# Patient Record
Sex: Female | Born: 1965
Health system: Southern US, Community
[De-identification: ages and names within clinical notes are randomized; demographics above are authoritative.]

## PROBLEM LIST (undated history)

## (undated) DIAGNOSIS — I10 Essential (primary) hypertension: Secondary | ICD-10-CM

## (undated) DIAGNOSIS — G2581 Restless legs syndrome: Secondary | ICD-10-CM

## (undated) DIAGNOSIS — R000 Tachycardia, unspecified: Secondary | ICD-10-CM

## (undated) DIAGNOSIS — F419 Anxiety disorder, unspecified: Secondary | ICD-10-CM

## (undated) HISTORY — PX: TUBAL LIGATION: SHX77

## (undated) HISTORY — DX: Anxiety disorder, unspecified: F41.9

## (undated) HISTORY — PX: CHOLECYSTECTOMY: SHX55

## (undated) HISTORY — DX: Restless legs syndrome: G25.81

## (undated) HISTORY — PX: ENDOMETRIAL ABLATION: SHX621

## (undated) HISTORY — DX: Essential (primary) hypertension: I10

---

## 2000-04-30 ENCOUNTER — Emergency Department (HOSPITAL_COMMUNITY): Admission: EM | Admit: 2000-04-30 | Discharge: 2000-04-30 | Payer: Self-pay | Admitting: Emergency Medicine

## 2000-04-30 ENCOUNTER — Encounter: Payer: Self-pay | Admitting: Emergency Medicine

## 2001-06-15 ENCOUNTER — Encounter: Payer: Self-pay | Admitting: Internal Medicine

## 2001-06-15 ENCOUNTER — Ambulatory Visit (HOSPITAL_COMMUNITY): Admission: RE | Admit: 2001-06-15 | Discharge: 2001-06-15 | Payer: Self-pay | Admitting: Internal Medicine

## 2001-07-25 ENCOUNTER — Emergency Department (HOSPITAL_COMMUNITY): Admission: EM | Admit: 2001-07-25 | Discharge: 2001-07-25 | Payer: Self-pay | Admitting: Emergency Medicine

## 2001-07-25 ENCOUNTER — Encounter: Payer: Self-pay | Admitting: Emergency Medicine

## 2002-01-01 ENCOUNTER — Ambulatory Visit (HOSPITAL_COMMUNITY): Admission: RE | Admit: 2002-01-01 | Discharge: 2002-01-01 | Payer: Self-pay | Admitting: Family Medicine

## 2002-01-01 ENCOUNTER — Encounter: Payer: Self-pay | Admitting: Family Medicine

## 2002-01-01 ENCOUNTER — Emergency Department (HOSPITAL_COMMUNITY): Admission: EM | Admit: 2002-01-01 | Discharge: 2002-01-01 | Payer: Self-pay | Admitting: Emergency Medicine

## 2002-01-08 ENCOUNTER — Encounter: Payer: Self-pay | Admitting: Family Medicine

## 2002-01-08 ENCOUNTER — Ambulatory Visit (HOSPITAL_COMMUNITY): Admission: RE | Admit: 2002-01-08 | Discharge: 2002-01-08 | Payer: Self-pay | Admitting: Family Medicine

## 2002-01-09 ENCOUNTER — Ambulatory Visit (HOSPITAL_COMMUNITY): Admission: RE | Admit: 2002-01-09 | Discharge: 2002-01-09 | Payer: Self-pay | Admitting: General Surgery

## 2008-04-21 ENCOUNTER — Ambulatory Visit (HOSPITAL_COMMUNITY): Admission: RE | Admit: 2008-04-21 | Discharge: 2008-04-21 | Payer: Self-pay | Admitting: Internal Medicine

## 2008-09-12 ENCOUNTER — Ambulatory Visit (HOSPITAL_COMMUNITY): Admission: RE | Admit: 2008-09-12 | Discharge: 2008-09-12 | Payer: Self-pay | Admitting: Family Medicine

## 2009-02-08 ENCOUNTER — Emergency Department (HOSPITAL_COMMUNITY): Admission: EM | Admit: 2009-02-08 | Discharge: 2009-02-08 | Payer: Self-pay | Admitting: Emergency Medicine

## 2009-02-27 ENCOUNTER — Ambulatory Visit: Payer: Self-pay | Admitting: Cardiology

## 2009-03-06 ENCOUNTER — Ambulatory Visit: Payer: Self-pay | Admitting: Cardiology

## 2009-03-06 ENCOUNTER — Encounter: Payer: Self-pay | Admitting: Cardiology

## 2009-03-06 ENCOUNTER — Ambulatory Visit (HOSPITAL_COMMUNITY): Admission: RE | Admit: 2009-03-06 | Discharge: 2009-03-06 | Payer: Self-pay | Admitting: Cardiology

## 2009-03-17 ENCOUNTER — Ambulatory Visit: Payer: Self-pay | Admitting: Cardiology

## 2009-09-16 ENCOUNTER — Encounter: Payer: Self-pay | Admitting: Cardiology

## 2009-09-17 LAB — CONVERTED CEMR LAB
AST: 8 units/L (ref 0–37)
Albumin: 4.8 g/dL (ref 3.5–5.2)
Cholesterol: 154 mg/dL (ref 0–200)
Indirect Bilirubin: 0.3 mg/dL (ref 0.0–0.9)
LDL Cholesterol: 102 mg/dL — ABNORMAL HIGH (ref 0–99)
Total CHOL/HDL Ratio: 4.3
Triglycerides: 79 mg/dL (ref <150)
VLDL: 16 mg/dL (ref 0–40)

## 2009-10-01 ENCOUNTER — Other Ambulatory Visit: Admission: RE | Admit: 2009-10-01 | Discharge: 2009-10-01 | Payer: Self-pay | Admitting: Obstetrics and Gynecology

## 2009-10-14 ENCOUNTER — Ambulatory Visit (HOSPITAL_COMMUNITY): Admission: RE | Admit: 2009-10-14 | Discharge: 2009-10-14 | Payer: Self-pay | Admitting: Obstetrics and Gynecology

## 2009-10-21 ENCOUNTER — Ambulatory Visit (HOSPITAL_COMMUNITY): Admission: RE | Admit: 2009-10-21 | Discharge: 2009-10-21 | Payer: Self-pay | Admitting: Obstetrics and Gynecology

## 2010-07-23 ENCOUNTER — Emergency Department (HOSPITAL_COMMUNITY): Admission: EM | Admit: 2010-07-23 | Discharge: 2010-07-23 | Payer: Self-pay | Admitting: Emergency Medicine

## 2010-10-05 ENCOUNTER — Ambulatory Visit (HOSPITAL_COMMUNITY): Admission: RE | Admit: 2010-10-05 | Discharge: 2010-10-05 | Payer: Self-pay | Admitting: Obstetrics and Gynecology

## 2010-10-06 ENCOUNTER — Emergency Department (HOSPITAL_COMMUNITY): Admission: EM | Admit: 2010-10-06 | Discharge: 2010-10-06 | Payer: Self-pay | Admitting: Emergency Medicine

## 2011-03-09 LAB — BASIC METABOLIC PANEL
CO2: 28 mEq/L (ref 19–32)
GFR calc Af Amer: >60 mL/min (ref >60)
Glucose, Bld: 95 mg/dL (ref 70–99)
Potassium: 4.5 mEq/L (ref 3.5–5.1)
Sodium: 139 mEq/L (ref 135–145)

## 2011-03-09 LAB — URINALYSIS, ROUTINE W REFLEX MICROSCOPIC
Glucose, UA: 100 mg/dL — AB
Hgb urine dipstick: NEGATIVE
Ketones, ur: NEGATIVE mg/dL
Protein, ur: NEGATIVE mg/dL
Urobilinogen, UA: 0.2 mg/dL (ref 0.0–1.0)
pH: 6 (ref 5.0–8.0)

## 2011-03-09 LAB — CBC
HCT: 40.5 % (ref 36.0–46.0)
MCH: 30.9 pg (ref 26.0–34.0)
MCHC: 34.1 g/dL (ref 30.0–36.0)

## 2011-03-09 LAB — URINE CULTURE
Colony Count: NO GROWTH
Culture  Setup Time: 201110122141
Culture: NO GROWTH

## 2011-03-09 LAB — GLUCOSE, CAPILLARY: Glucose-Capillary: 130 mg/dL — ABNORMAL HIGH (ref 70–99)

## 2011-03-12 LAB — COMPREHENSIVE METABOLIC PANEL
ALT: 14 U/L (ref 0–35)
Albumin: 4.1 g/dL (ref 3.5–5.2)
Chloride: 102 mEq/L (ref 96–112)
Creatinine, Ser: 0.82 mg/dL (ref 0.4–1.2)
GFR calc non Af Amer: >60 mL/min (ref >60)
Glucose, Bld: 89 mg/dL (ref 70–99)
Potassium: 3.5 mEq/L (ref 3.5–5.1)
Total Protein: 7.2 g/dL (ref 6.0–8.3)

## 2011-03-12 LAB — CBC
HCT: 39.1 % (ref 36.0–46.0)
Hemoglobin: 13.4 g/dL (ref 12.0–15.0)
MCH: 31.2 pg (ref 26.0–34.0)
MCHC: 34.3 g/dL (ref 30.0–36.0)
MCV: 91 fL (ref 78.0–100.0)
Platelets: 205 10*3/uL (ref 150–400)
RBC: 4.29 MIL/uL (ref 3.87–5.11)

## 2011-03-12 LAB — DIFFERENTIAL: Monocytes Relative: 5 % (ref 3–12)

## 2011-04-12 LAB — CBC
HCT: 42.5 % (ref 36.0–46.0)
Hemoglobin: 14.8 g/dL (ref 12.0–15.0)
MCV: 90.9 fL (ref 78.0–100.0)
RBC: 4.67 MIL/uL (ref 3.87–5.11)
RDW: 13.2 % (ref 11.5–15.5)

## 2011-04-12 LAB — BASIC METABOLIC PANEL
Calcium: 9.5 mg/dL (ref 8.4–10.5)
GFR calc Af Amer: >60 mL/min (ref >60)
Glucose, Bld: 113 mg/dL — ABNORMAL HIGH (ref 70–99)
Potassium: 3.3 mEq/L — ABNORMAL LOW (ref 3.5–5.1)
Sodium: 139 mEq/L (ref 135–145)

## 2011-04-12 LAB — URINALYSIS, ROUTINE W REFLEX MICROSCOPIC
Glucose, UA: NEGATIVE mg/dL
Ketones, ur: NEGATIVE mg/dL
Nitrite: NEGATIVE
Protein, ur: NEGATIVE mg/dL
pH: 5.5 (ref 5.0–8.0)

## 2011-04-12 LAB — POCT CARDIAC MARKERS
CKMB, poc: <1 ng/mL — ABNORMAL LOW (ref 1.0–8.0)
Troponin i, poc: <0.05 ng/mL (ref 0.00–0.09)
Troponin i, poc: <0.05 ng/mL (ref 0.00–0.09)

## 2011-04-12 LAB — DIFFERENTIAL
Basophils Relative: 0 % (ref 0–1)
Eosinophils Absolute: 0.2 10*3/uL (ref 0.0–0.7)
Monocytes Absolute: 0.8 10*3/uL (ref 0.1–1.0)
Monocytes Relative: 5 % (ref 3–12)
Neutrophils Relative %: 71 % (ref 43–77)

## 2011-04-12 LAB — D-DIMER, QUANTITATIVE: D-Dimer, Quant: 0.26 ug/mL-FEU (ref 0.00–0.48)

## 2011-05-10 NOTE — Assessment & Plan Note (Signed)
[facility]                       [facility] CARDIOLOG[facility] OFFICE NOTE   SHAZIA, MITCHENER                   MRN:          [id number]  DATE:02/27/2009                            DOB:          1966-04-21    REFERRING PHYSICIAN:  Catalina Pizza, M.D.   REASON FOR VISIT:  Chest pain.   HISTORY OF PRESENT ILLNESS:  Joanna Simpson is a 45 year old female  patient who recently developed chest pain.  She has had borderline high  blood pressures in the past and her pressures started to increase  recently.  She developed chest discomfort about 3 weeks ago while at  rest.  She described this as a dull sensation in her substernal chest.  She did have radiation up to her right jaw as well as associated  shortness of breath and diaphoresis.  She had no nausea.  She had no  syncope or near syncope.  She was evaluated at the [facility]  emergency room.  She had point of care markers negative x1.  Her other  lab work was negative.  Notably, her D-dimer was negative at 0.26.  Her  only abnormality was a low potassium at 3.3 and an elevated white count  at 15,400.  Chest x-ray was normal.  She was noted to have a blood  pressure of 170/110 and was referred back to her primary care physician  for further evaluation.  Dr. Margo Aye placed her on Coreg CR for her blood  pressure and asked her to see [place] today for further evaluation of her  chest pain.  Since she went to the emergency room, she has had several  episodes of chest discomfort that occurred at rest.  They are brief in  nature and only lasted a couple of minutes.  She has associated  shortness of breath.  She has no other associated symptoms.  She denies  any exertional chest pain or shortness of breath.   PAST MEDICAL HISTORY:  As noted above.  In addition, she has:  1. Congenital solitary right kidney.  2. Status post cholecystectomy.   MEDICATIONS:  1. Coreg CR 20 mg daily.  2. Lorazepam p.r.n.  3. BC  Powder p.r.n.   ALLERGIES:  No known drug allergies.   SOCIAL HISTORY:  The patient smokes half pack cigarettes per day for the  last 20 years.  The patient lives in [place].  She is married, has 3  children.  She is a Futures trader.  She is also going to school.   FAMILY HISTORY:  Insignificant for premature CAD.   REVIEW OF SYSTEMS:  Please see HPI.  She has had significant bouts of  right leg pain.  Over the last several months, she has had several  studies which included an EMG that was done recently.  She says that  this was negative.  She had venous Dopplers also performed in April 2009  that showed no evidence of DVT.  She notes pain at rest as well as with  exertion.  Mainly in her lateral calf and sometimes up into her lateral  thigh.  She denies fevers, chills, cough, melena, hematochezia,  hematuria, dysuria.  Rest of the review of systems are negative.   PHYSICAL EXAMINATION:  GENERAL:  She is a well-nourished, well-developed  female in no acute distress.  VITAL SIGNS:  Blood pressure is 130/90, pulse 82, weight 169 pounds.  HEENT:  Normal.  NECK:  Without JVD.  LYMPHATICS:  Without lymphadenopathy.  ENDOCRINE:  Without  thyromegaly.  CARDIAC:  S1 and S2. Regular rate and rhythm.  No murmur.  LUNGS:  Clear to auscultation bilaterally.  No wheezing, no rales.  ABDOMEN:  Soft, nontender with normoactive bowel sounds.  No  organomegaly.  EXTREMITIES:  Without edema.  NEUROLOGIC:  She is alert and oriented x3.  Cranial nerves II through  XII grossly intact.  SKIN:  Warm and dry.  VASCULAR:  I could not appreciate carotid bruits bilaterally.  Femoral  artery pulses are difficult to palpate through her clothing.  No bruits  are auscultated.  Dorsalis pedis and posterior tibial pulses are 2+  bilaterally.   Electrocardiogram reveals sinus rhythm with a heart rate of 82, normal  axis, nonspecific ST-T wave changes, poor R-wave progression.   ASSESSMENT AND PLAN:  1. Chest  pain.  This patient has atypical chest symptoms for ischemia.      She does have some risk factors for coronary artery disease      including smoking history and hypertension.  I have discussed the      case today with Dr. Dietrich Pates who has also seen the patient.  After      further review with Dr. Dietrich Pates, we have decided to proceed with      baseline echocardiogram as well as a stress echocardiogram to      assess for ischemia.  She is already on aspirin.  We will also give      her nitroglycerin to take p.r.n.  We will see her back after her      stress test.  We will contact Dr. Margo Aye to see if she has had a      recent lipid panel.  If not, we will go ahead and draw lipids.  2. Right leg pain.  She has some symptoms that are somewhat concerning      for peripheral arterial disease.  She has had a negative workup      thus far.  On physical exam, she has good pulses, but we will go      ahead and set her for ABIs to rule out the possibility of      peripheral arterial disease, especially in the setting of her      ongoing tobacco abuse.  3. Hypertension.  This is better controlled on Coreg.  After further      review with Dr. Dietrich Pates, we have decided to place her on HCTZ 25      mg half a tablet daily.  She will have a BMET drawn in 1 week's      time to reassess her renal function and potassium.  She has been      asked to increase her dietary potassium for now.   DISPOSITION:  The patient will be brought back in followup after a  stress test with Dr. Dietrich Pates in 2 weeks.      Tereso Newcomer, PA-C  Electronically Signed      Gerrit Friends. Dietrich Pates, MD, [facility]  Electronically Signed   SW/MedQ  DD: 02/27/2009  DT: 02/28/2009  Job #: [id number]

## 2011-05-10 NOTE — Letter (Signed)
March 17, 2009    Catalina Pizza, MD  [address] [place],  [place] [address]   RE:  Joanna Simpson, Joanna Simpson  MRN:  [id number]  /  DOB:  05-20-1966   Dear Ian Malkin,   [name] returns to the office for continued assessment and  treatment of hypertension, dyslipidemia, tobacco use, and chest  discomfort.  Since her last visit, she has become motivated to  substantially improve her health profile.  She is dieting and has lost 5  pounds.  She is exercising regularly.  She is taking her medication as  recommended except that she misunderstood the directions regarding  hydrochlorothiazide and carvedilol, and has stopped her carvedilol.  Her  only other medication is famotidine 20 mg daily.   She has not had chest discomfort.  There has been no lightheadedness and  certainly no loss of consciousness.  She has had no dyspnea.   Testing has generally been good.  Her chemistry profile is normal.  Lipids are good in terms of total cholesterol of 137 and LDL of 86.  Triglycerides are normal; however, HDL is low at 25.  She is  premenopausal and has had normal thyroid function in the past.  She does  not drink alcohol nor does she intend to start.   ABIs were normal.  She continues to have intermittent discomfort,  primarily in her right leg.  The other day, she attempted to arise from  a chair, experienced numbness in both lower extremities and ultimately  had to sit back down.  She has had no bowel or bladder symptoms.  She  has no unsteadiness of gait.  She has no muscle weakness.   Her stress echocardiogram was negative for ischemia.  The heart was  structurally normal with normal left ventricular systolic function.  She  did have mild LVH.  She achieved a good work load, and a good peak heart  rate.  She was reported to have a hypertensive response to exercise;  however, the highest blood pressure reported was 162/82.   PHYSICAL EXAMINATION:  GENERAL:  Pleasant, somewhat overweight woman in  no acute distress.  VITAL SIGNS:  The weight is 165, blood pressure 125/85; initially  120/95, heart rate 75 and regular.  NECK:  No jugular venous distention; no carotid bruits.  LUNGS:  Clear.  CARDIAC:  Normal first and second heart sounds.  EXTREMITIES:  No edema.   IMPRESSION:  [name] is doing well in general.  She is at very low  risk for vascular disease.  Blood pressure control is marginal.  We will  resume carvedilol at a dose of 12.5 mg b.i.d.  She will continue  hydrochlorothiazide 12.5 mg daily.  She does not require pharmacologic  therapy for a low HDL value.  Her new found interest in exercise may  result in some improvement.  There is no etiology identified as yet for  her lower extremity symptoms.  As long as her neurologic exam remains  normal, she may need to tolerate the minor problems that she is  experiencing.   Thanks so much for sending this nice woman to see me.  Please let me  know at any time that I can assist in her care.  We discussed cessation  of tobacco.  She is eager to do this, but I suggest that she defer it  until she reaches her ideal weight or until she reaches a stable weight.    Sincerely,  Gerrit Friends. Dietrich Pates, MD, [facility]  Electronically Signed    RMR/MedQ  DD: 03/17/2009  DT: 03/18/2009  Job #: [id number]

## 2011-05-13 NOTE — Op Note (Signed)
[facility]  Patient:    Joanna Simpson, MCGILLIS Visit Number: [id number] MRN: [id number]          Service Type: OUT Location: RAD Attending Physician:  Darlin Priestly Dictated by:   Franky Macho, M.D. Proc. Date: 01/09/02 Admit Date:  01/08/2002   CC:         Dorthey Sawyer, M.D.   Operative Report  PREOPERATIVE DIAGNOSIS:  Chronic cholecystitis.  POSTOPERATIVE DIAGNOSIS:  Chronic cholecystitis.  PROCEDURE:  Laparoscopic cholecystectomy.  SURGEON:  Franky Macho, M.D.  ASSISTANT:  Arna Snipe, M.D.  ANESTHESIA:  General endotracheal.  INDICATIONS:  The patient is a 45 year old white female who presents with biliary colic and a gallbladder polyps.  The risks and the benefits of the procedure including bleeding, infection, hepatobiliary injury, and the possibility of an open procedure were fully explained to the patient, giving informed consent.  PROCEDURE NOTE:  The patient was placed in the supine position.  After induction of general endotracheal anesthesia, the abdomen was prepped and draped using the usual sterile technique with Betadine.  A supraumbilical incision was made down to the fascia.  The Veress needle was introduced into the uterine cavity, and confirmation of placement was done using the saline drop test.  The abdomen was then insufflated to 16 mmHg. An 11 mm trocar was introduced into the abdominal cavity under direct visualization without difficulty.  The patient was placed in a reverse Trendelenburg position, and an additional 11 mm trocar and 5 mm trocars were placed in the upper abdomen under direct visualization. The liver was inspected and noted to be within normal limits.  The gallbladder was retracted superiorly and laterally.  The dissection was begun around the infundibulum of the gallbladder.  The cystic duct was first identified.  Its juncture to the infundibulum was identified, and the clips were placed  proximally and distally on the cystic duct, and the cystic duct was divided.  This was likewise done to the cystic artery.  The gallbladder was then freed away from the gallbladder fossa using Bovie electrocautery.  The gallbladder was delivered through the epigastric trocar site without difficulty.  The gallbladder fossa was inspected, and no abnormal bleeding or bile leakage was noted.  Surgicel was placed in the gallbladder fossa.  The subhepatic space as well as right hepatic gutter were irrigated with normal saline.  All fluid and air were then evacuated from the abdominal cavity prior to removal of the trocars.  The supraumbilical fascia was reapproximated using a 0 Vicryl interrupted suture.  0.5% Marcaine was instilled in the wounds, and the wounds were closed with staples.  All needle counts were correct at the end of the procedure.  The patient was extubated in the operating room and went back to the recovery room, awake and in stable condition.  COMPLICATIONS:  None.  SPECIMENS:  Gallbladder.  BLOOD LOSS:  None. Dictated by:   Franky Macho, M.D. Attending Physician:  Darlin Priestly DD:  01/09/02 TD:  01/09/02 Job: [id number]

## 2011-06-17 ENCOUNTER — Emergency Department (HOSPITAL_COMMUNITY)
Admission: EM | Admit: 2011-06-17 | Discharge: 2011-06-17 | Disposition: A | Discharge status: Home / Self Care | Payer: PRIVATE HEALTH INSURANCE | Attending: Emergency Medicine | Admitting: Emergency Medicine

## 2011-06-17 ENCOUNTER — Emergency Department (HOSPITAL_COMMUNITY): Payer: PRIVATE HEALTH INSURANCE

## 2011-06-17 DIAGNOSIS — F17200 Nicotine dependence, unspecified, uncomplicated: Secondary | ICD-10-CM | POA: Insufficient documentation

## 2011-06-17 DIAGNOSIS — Z8249 Family history of ischemic heart disease and other diseases of the circulatory system: Secondary | ICD-10-CM | POA: Insufficient documentation

## 2011-06-17 DIAGNOSIS — I10 Essential (primary) hypertension: Secondary | ICD-10-CM | POA: Insufficient documentation

## 2011-06-17 DIAGNOSIS — R61 Generalized hyperhidrosis: Secondary | ICD-10-CM | POA: Insufficient documentation

## 2011-06-17 DIAGNOSIS — R079 Chest pain, unspecified: Secondary | ICD-10-CM | POA: Insufficient documentation

## 2011-06-17 LAB — BASIC METABOLIC PANEL
BUN: 24 mg/dL — ABNORMAL HIGH (ref 6–23)
Chloride: 105 mEq/L (ref 96–112)
GFR calc Af Amer: >60 mL/min (ref >60)
GFR calc non Af Amer: >60 mL/min (ref >60)
Potassium: 3.4 mEq/L — ABNORMAL LOW (ref 3.5–5.1)
Sodium: 139 mEq/L (ref 135–145)

## 2011-06-17 LAB — CK TOTAL AND CKMB (NOT AT ARMC)
CK, MB: 1.5 ng/mL (ref 0.3–4.0)
CK, MB: 1.5 ng/mL (ref 0.3–4.0)
Relative Index: INVALID (ref 0.0–2.5)
Relative Index: INVALID (ref 0.0–2.5)

## 2011-06-17 LAB — DIFFERENTIAL
Basophils Absolute: 0.1 10*3/uL (ref 0.0–0.1)
Basophils Relative: 1 % (ref 0–1)
Eosinophils Absolute: 0.2 10*3/uL (ref 0.0–0.7)
Eosinophils Relative: 1 % (ref 0–5)
Lymphocytes Relative: 21 % (ref 12–46)
Monocytes Absolute: 0.9 10*3/uL (ref 0.1–1.0)

## 2011-06-17 LAB — CBC
HCT: 40.9 % (ref 36.0–46.0)
MCHC: 34.7 g/dL (ref 30.0–36.0)
Platelets: 222 10*3/uL (ref 150–400)
RDW: 12.8 % (ref 11.5–15.5)
WBC: 13.3 10*3/uL — ABNORMAL HIGH (ref 4.0–10.5)

## 2011-06-17 LAB — D-DIMER, QUANTITATIVE: D-Dimer, Quant: 0.22 ug/mL-FEU (ref 0.00–0.48)

## 2011-06-17 LAB — TROPONIN I: Troponin I: <0.3 ng/mL (ref <0.30)

## 2012-07-16 ENCOUNTER — Other Ambulatory Visit (HOSPITAL_COMMUNITY): Payer: Self-pay | Admitting: Interventional Radiology

## 2012-07-16 DIAGNOSIS — M545 Low back pain, unspecified: Secondary | ICD-10-CM

## 2012-07-17 ENCOUNTER — Other Ambulatory Visit (HOSPITAL_COMMUNITY): Payer: Self-pay | Admitting: Rheumatology

## 2012-07-17 DIAGNOSIS — M545 Low back pain, unspecified: Secondary | ICD-10-CM

## 2012-07-19 ENCOUNTER — Ambulatory Visit (HOSPITAL_COMMUNITY)
Admission: RE | Admit: 2012-07-19 | Discharge: 2012-07-19 | Disposition: A | Discharge status: Home / Self Care | Payer: PRIVATE HEALTH INSURANCE | Source: Ambulatory Visit | Attending: Interventional Radiology | Admitting: Interventional Radiology

## 2012-07-19 DIAGNOSIS — M79609 Pain in unspecified limb: Secondary | ICD-10-CM | POA: Insufficient documentation

## 2012-07-19 DIAGNOSIS — M545 Low back pain, unspecified: Secondary | ICD-10-CM

## 2012-07-19 DIAGNOSIS — M25559 Pain in unspecified hip: Secondary | ICD-10-CM | POA: Insufficient documentation

## 2012-07-19 DIAGNOSIS — M5126 Other intervertebral disc displacement, lumbar region: Secondary | ICD-10-CM | POA: Insufficient documentation

## 2012-10-24 ENCOUNTER — Other Ambulatory Visit: Payer: Self-pay | Admitting: Obstetrics and Gynecology

## 2012-10-24 DIAGNOSIS — Z139 Encounter for screening, unspecified: Secondary | ICD-10-CM

## 2012-11-02 ENCOUNTER — Inpatient Hospital Stay (HOSPITAL_COMMUNITY): Admission: RE | Admit: 2012-11-02 | Payer: PRIVATE HEALTH INSURANCE | Source: Ambulatory Visit

## 2013-09-30 ENCOUNTER — Ambulatory Visit (HOSPITAL_COMMUNITY)
Admission: RE | Admit: 2013-09-30 | Discharge: 2013-09-30 | Disposition: A | Discharge status: Home / Self Care | Payer: 59 | Source: Ambulatory Visit | Attending: Obstetrics and Gynecology | Admitting: Obstetrics and Gynecology

## 2013-09-30 DIAGNOSIS — Z1231 Encounter for screening mammogram for malignant neoplasm of breast: Secondary | ICD-10-CM | POA: Insufficient documentation

## 2013-09-30 DIAGNOSIS — Z139 Encounter for screening, unspecified: Secondary | ICD-10-CM

## 2013-10-21 ENCOUNTER — Other Ambulatory Visit (HOSPITAL_COMMUNITY)
Admission: RE | Admit: 2013-10-21 | Discharge: 2013-10-21 | Disposition: A | Discharge status: Home / Self Care | Payer: 59 | Source: Ambulatory Visit | Attending: Obstetrics and Gynecology | Admitting: Obstetrics and Gynecology

## 2013-10-21 ENCOUNTER — Ambulatory Visit (INDEPENDENT_AMBULATORY_CARE_PROVIDER_SITE_OTHER): Payer: 59 | Admitting: Obstetrics and Gynecology

## 2013-10-21 ENCOUNTER — Encounter (INDEPENDENT_AMBULATORY_CARE_PROVIDER_SITE_OTHER): Payer: Self-pay

## 2013-10-21 ENCOUNTER — Encounter: Payer: Self-pay | Admitting: Obstetrics and Gynecology

## 2013-10-21 VITALS — BP 144/88 | Ht 65.5 in | Wt 161.6 lb

## 2013-10-21 DIAGNOSIS — N393 Stress incontinence (female) (male): Secondary | ICD-10-CM | POA: Insufficient documentation

## 2013-10-21 DIAGNOSIS — Z1151 Encounter for screening for human papillomavirus (HPV): Secondary | ICD-10-CM | POA: Insufficient documentation

## 2013-10-21 DIAGNOSIS — Z1212 Encounter for screening for malignant neoplasm of rectum: Secondary | ICD-10-CM

## 2013-10-21 DIAGNOSIS — Z01419 Encounter for gynecological examination (general) (routine) without abnormal findings: Secondary | ICD-10-CM | POA: Insufficient documentation

## 2013-10-21 MED ORDER — TOLTERODINE TARTRATE ER 2 MG PO CP24: 2.0000 mg | ORAL_CAPSULE | Freq: Every day | ORAL | Status: DC

## 2013-10-21 NOTE — Patient Instructions (Addendum)
Please begin taking the bladder medication daily if you have further difficulties emptying her bladder please discontinued this medicine Urodynamic Study A urodynamic study is a set of tests and X-rays. These tests help to find out why you are having problems holding on to your pee (urine) or with peeing (urinating). It helps the doctor see your:  Bladder.  Urethra.  Valves in your body that control your pee (sphincters). TEST  Two thin tubes (catheters) are used. One is put in your bladder and the other is put into where your poop comes out (rectum).  The tube that is put into your bladder will be filled with a cup of germ free (sterile) water. The tubes help check how your bladder is doing as it is being filled up.  Once your bladder is filled, X-rays are taken while you cough or push down as if you were trying to poop (have a bowel movement).  This test will show why you are having a problem with leaking pee.  In the last part of the test, you will need to pee while the tube is still in your bladder.  The whole test will take about 30 to 45 minutes. When it is done, your doctor will talk to you about what kinds of treatments may work best for you. Document Released: 11/24/2008 Document Revised: 03/05/2012 Document Reviewed: 11/24/2008 [facility] Patient Information 2014 [place], [place].

## 2013-10-21 NOTE — Progress Notes (Signed)
Patient ID: Joanna Simpson, female   DOB: Aug 03, 1966, 47 y.o.   MRN: [id number] Pt here for annual exam today and c/o urinary incontinence.  Assessment:  Annual Gyn Exam Stress urinary incontinence, with need for urodynamics referral History of congenital single kidney on the right  mild urge incontinence Plan:  1. pap smear done, next pap due 87yr 2. return annually or prn 3    Annual mammogram advised Trial of Vesicare 5 mg daily.patient aware to pay attention be sure that no urinary retention symptoms are increased Subjective:  Joanna Simpson is a 47 y.o. female No obstetric history on file. who presents for annual exam. No LMP recorded. Patient has had an ablation. The patient has complaints today of Joanna Simpson mentions that in addition to loss of control of her urine she sometimes has to double void in order to get her bladder emptying she has pain above the symphysis pubis when she needs to void  The following portions of the patient's history were reviewed and updated as appropriate: allergies, current medications, past family history, past medical history, past social history, past surgical history and problem list.  Review of Systems Constitutional: negative, occasional constipation requiring Dulcolax tablets Gastrointestinal: negative except for constipation Genitourinary: sui. Nocturia x 1-2, has sense of difficulty emptying, sometimes has urinary loss simply with walking also positive for urge symptoms and voiding onset prior to reaching the bathroom  Objective:  BP 144/88  Ht 5' 5.5" (1.664 m)  Wt 161 lb 9.6 oz (73.301 kg)  BMI 26.47 kg/m2   BMI: Body mass index is 26.47 kg/(m^2).  General Appearance: Alert, appropriate appearance for age. No acute distress HEENT: Grossly normal Neck / Thyroid:  Cardiovascular: RRR; normal S1, S2, no murmur Lungs: CTA bilaterally Back: No CVAT Breast Exam: No masses or nodes.No dimpling, nipple retraction or  discharge. Gastrointestinal: Soft, non-tender, no masses or organomegaly Pelvic Exam: Vulva and vagina appear normal. Bimanual exam reveals normal uterus and adnexa. Rectovaginal: normal rectal, no masses and guaiac negative stool obtained Lymphatic Exam: Non-palpable nodes in neck, clavicular, axillary, or inguinal regions Skin: no rash or abnormalities Neurologic: Normal gait and speech, no tremor  Psychiatric: Alert and oriented, appropriate affect.  Urinalysis:Not done  Joanna Simpson. MD Pgr [phone] 9:53 AM

## 2013-10-23 ENCOUNTER — Emergency Department (HOSPITAL_COMMUNITY)
Admission: EM | Admit: 2013-10-23 | Discharge: 2013-10-23 | Disposition: A | Discharge status: Home / Self Care | Payer: 59 | Attending: Emergency Medicine | Admitting: Emergency Medicine

## 2013-10-23 ENCOUNTER — Encounter (HOSPITAL_COMMUNITY): Payer: Self-pay | Admitting: Emergency Medicine

## 2013-10-23 ENCOUNTER — Emergency Department (HOSPITAL_COMMUNITY): Payer: 59

## 2013-10-23 DIAGNOSIS — R209 Unspecified disturbances of skin sensation: Secondary | ICD-10-CM | POA: Insufficient documentation

## 2013-10-23 DIAGNOSIS — J069 Acute upper respiratory infection, unspecified: Secondary | ICD-10-CM | POA: Insufficient documentation

## 2013-10-23 DIAGNOSIS — R0602 Shortness of breath: Secondary | ICD-10-CM | POA: Insufficient documentation

## 2013-10-23 DIAGNOSIS — Z79899 Other long term (current) drug therapy: Secondary | ICD-10-CM | POA: Insufficient documentation

## 2013-10-23 DIAGNOSIS — R51 Headache: Secondary | ICD-10-CM | POA: Insufficient documentation

## 2013-10-23 DIAGNOSIS — G2581 Restless legs syndrome: Secondary | ICD-10-CM | POA: Insufficient documentation

## 2013-10-23 DIAGNOSIS — F411 Generalized anxiety disorder: Secondary | ICD-10-CM | POA: Insufficient documentation

## 2013-10-23 DIAGNOSIS — F17200 Nicotine dependence, unspecified, uncomplicated: Secondary | ICD-10-CM | POA: Insufficient documentation

## 2013-10-23 DIAGNOSIS — R519 Headache, unspecified: Secondary | ICD-10-CM

## 2013-10-23 DIAGNOSIS — R079 Chest pain, unspecified: Secondary | ICD-10-CM | POA: Insufficient documentation

## 2013-10-23 DIAGNOSIS — I10 Essential (primary) hypertension: Secondary | ICD-10-CM | POA: Insufficient documentation

## 2013-10-23 DIAGNOSIS — R202 Paresthesia of skin: Secondary | ICD-10-CM

## 2013-10-23 HISTORY — DX: Tachycardia, unspecified: R00.0

## 2013-10-23 LAB — BASIC METABOLIC PANEL
BUN: 15 mg/dL (ref 6–23)
CO2: 29 mEq/L (ref 19–32)
Chloride: 96 mEq/L (ref 96–112)
Creatinine, Ser: 0.8 mg/dL (ref 0.50–1.10)
Glucose, Bld: 99 mg/dL (ref 70–99)

## 2013-10-23 LAB — CBC WITH DIFFERENTIAL/PLATELET
HCT: 43.7 % (ref 36.0–46.0)
Hemoglobin: 15.3 g/dL — ABNORMAL HIGH (ref 12.0–15.0)
Lymphocytes Relative: 24 % (ref 12–46)
Lymphs Abs: 3.5 10*3/uL (ref 0.7–4.0)
MCHC: 35 g/dL (ref 30.0–36.0)
Monocytes Absolute: 0.7 10*3/uL (ref 0.1–1.0)
Monocytes Relative: 5 % (ref 3–12)
Neutro Abs: 10.1 10*3/uL — ABNORMAL HIGH (ref 1.7–7.7)
Platelets: 231 10*3/uL (ref 150–400)

## 2013-10-23 MED ORDER — PROCHLORPERAZINE EDISYLATE 5 MG/ML IJ SOLN
10.0000 mg | Freq: Once | INTRAMUSCULAR | Status: AC
  Administered 2013-10-23: 10 mg via INTRAVENOUS
  Filled 2013-10-23: qty 2

## 2013-10-23 MED ORDER — SODIUM CHLORIDE 0.9 % IV BOLUS (SEPSIS)
500.0000 mL | Freq: Once | INTRAVENOUS | Status: AC
  Administered 2013-10-23: 17:00:00 via INTRAVENOUS

## 2013-10-23 MED ORDER — KETOROLAC TROMETHAMINE 30 MG/ML IJ SOLN
30.0000 mg | Freq: Once | INTRAMUSCULAR | Status: AC
  Administered 2013-10-23: 30 mg via INTRAVENOUS
  Filled 2013-10-23: qty 1

## 2013-10-23 NOTE — ED Notes (Addendum)
Pt c/o  chest pain and tachycardia since yesterday.  Reports symptoms got better then started back during the night.   Reports chest pain is gone at this time and HR slower but,  c/o arms feeling numb and heavy x 3 hours.  Also c/o headache.

## 2013-10-23 NOTE — ED Provider Notes (Signed)
CSN: [id number]     Arrival date & time 10/23/13  1556 History  This chart was scribed for [employer]. Rubin Payor, MD by Ardelia Mems, ED Scribe. This patient was seen in room APA18/APA18 and the patient's care was started at 4:41 PM.   Chief Complaint  Patient presents with  . arms numb     The history is provided by the patient. No language interpreter was used.    HPI Comments: Joanna Simpson is a 47 y.o. female with a history of anxiety, HTN and tachycardia who presents to the Emergency Department complaining of persistent bilateral arm numbness onset yesterday. She also reports paresthesias in the fingers of her bilateral hands. She reports associated episodes of intermittent, non-radiating center chest pain onset yesterday which she states she is not having currently. She states that there are no modifying factors for her chest pain. She reports associated SOB during her episodes of chest pain. She also states that she has had a persistent, non-productive cough for the past month onset after burning some trash, and she states that this cough has worsened over the past 5 days. She states that she has no history of pulmonary disease. She also reports a persistent, severe headache over the past 5 days. She states that she has a history of tachycardia which she has had to receive adenosine shots for in the past. She states that she has no history of thyroid disease. She denies fever or any other symptoms. She states that she does not use cocaine, caffeine, weight loss drugs or any other stimulants. She is a current every day smoker of 5 packs/day. She states that she is not driving today. She states that she has no medication allergies.  PCP- Dr. Assunta Found   Past Medical History  Diagnosis Date  . Hypertension   . Anxiety   . Restless leg syndrome   . Tachycardia    Past Surgical History  Procedure Laterality Date  . Tubal ligation    . Cholecystectomy    . Endometrial ablation      Family History  Problem Relation Age of Onset  . Diabetes Mother   . Hypertension Mother   . Cancer Mother     pancreatic  . Diabetes Father   . Hypertension Father   . Cancer Father     colon  . Diabetes Sister    History  Substance Use Topics  . Smoking status: Current Every Day Smoker -- 5.00 packs/day    Types: Cigarettes  . Smokeless tobacco: Never Used  . Alcohol Use: No   OB History   Grav Para Term Preterm Abortions TAB SAB Ect Mult Living                 Review of Systems  Constitutional: Negative for fever.  Respiratory: Positive for shortness of breath.   Cardiovascular: Positive for chest pain.  Neurological: Positive for numbness (bilateral arms.) and headaches.       Paresthesias to fingers of bilateral hands.  All other systems reviewed and are negative.   Allergies  Review of patient's allergies indicates no known allergies.  Home Medications   Current Outpatient Rx  Name  Route  Sig  Dispense  Refill  . ALPRAZolam (XANAX) 1 MG tablet   Oral   Take 1 mg by mouth as needed.          Marland Kitchen rOPINIRole (REQUIP) 2 MG tablet   Oral   Take 2 mg by mouth at bedtime.          Marland Kitchen  tolterodine (DETROL LA) 2 MG 24 hr capsule   Oral   Take 1 capsule (2 mg total) by mouth daily.   30 capsule   3    Triage Vitals: BP 134/79  Pulse 97  Temp(Src) 98.1 F (36.7 C) (Oral)  Resp 18  Ht 5\' 5"  (1.651 m)  Wt 155 lb (70.308 kg)  BMI 25.79 kg/m2  SpO2 100%  Physical Exam  Nursing note and vitals reviewed. Constitutional: She is oriented to person, place, and time. She appears well-developed and well-nourished. No distress.  HENT:  Head: Normocephalic and atraumatic.  Eyes: EOM are normal.  Neck: Neck supple. No tracheal deviation present.  Cardiovascular: Normal rate, regular rhythm and normal heart sounds.   Pulmonary/Chest: Effort normal. No respiratory distress.  Mildly harsh breath sounds.  Abdominal: Soft. There is no tenderness.   Musculoskeletal: Normal range of motion.  Neurological: She is alert and oriented to person, place, and time. No cranial nerve deficit. She exhibits normal muscle tone. Coordination normal.  Normal neuro exam.  Skin: Skin is warm and dry.  Psychiatric: She has a normal mood and affect. Her behavior is normal.    ED Course  Procedures (including critical care time)  COORDINATION OF CARE: 4:48 PM- Discussed plan to obtain a XR and diagnostic lab work. Will order IV fluids, Toradol and Compazine. Pt advised of plan for treatment and pt agrees.  Medications  sodium chloride 0.9 % bolus 500 mL ( Intravenous New Bag/Given 10/23/13 1713)  ketorolac (TORADOL) 30 MG/ML injection 30 mg (30 mg Intravenous Given 10/23/13 1713)  prochlorperazine (COMPAZINE) injection 10 mg (10 mg Intravenous Given 10/23/13 1713)   Labs Review Labs Reviewed  CBC WITH DIFFERENTIAL - Abnormal; Notable for the following:    WBC 14.4 (*)    Hemoglobin 15.3 (*)    Neutro Abs 10.1 (*)    All other components within normal limits  BASIC METABOLIC PANEL - Abnormal; Notable for the following:    GFR calc non Af Amer 88 (*)    All other components within normal limits  TROPONIN I   Imaging Review Dg Chest 2 View  10/23/2013   CLINICAL DATA:  Chest pain. History of hypertension.  EXAM: CHEST  2 VIEW  COMPARISON:  06/17/2011  FINDINGS: The heart size and mediastinal contours are within normal limits. Both lungs are clear. Lung volumes slightly low. Trachea is midline. The visualized skeletal structures are unremarkable.  IMPRESSION: No active cardiopulmonary disease.   Electronically Signed   By: Britta Mccreedy M.D.   On: 10/23/2013 17:46    EKG Interpretation     Ventricular Rate:  76 PR Interval:  116 QRS Duration: 86 QT Interval:  374 QTC Calculation: 420 R Axis:   60 Text Interpretation:  Normal sinus rhythm Normal ECG When compared with ECG of 17-Jun-2011 14:52, No significant change was found             MDM   1. Paresthesias   2. Headache   3. URI (upper respiratory infection)    A patient presents with paresthesias a headache. She also felt her head racing. She's had some cough recently. She may have a URI component. Headache feels better after treatment with migraine cocktail and her paresthesias have resolved. May be a component of anxiety also. Doubt stroke as a cause. Will discharge home and patient can follow with her PCP.   I personally performed the services described in this documentation, which was scribed in my presence. The recorded  information has been reviewed and is accurate.     Juliet Rude. Rubin Payor, MD 10/23/13 1816

## 2013-10-23 NOTE — ED Notes (Signed)
Discussed pt with Dr. Adriana Simas, no further orders at this time.

## 2013-10-23 NOTE — ED Notes (Signed)
Pt also reports has had a nonproductive cough x 1 month after burning  Some brush.

## 2014-01-20 ENCOUNTER — Ambulatory Visit: Payer: 59 | Admitting: Obstetrics and Gynecology

## 2014-11-18 ENCOUNTER — Other Ambulatory Visit (HOSPITAL_COMMUNITY): Payer: Self-pay | Admitting: Family Medicine

## 2014-11-18 DIAGNOSIS — Z1231 Encounter for screening mammogram for malignant neoplasm of breast: Secondary | ICD-10-CM

## 2014-12-01 ENCOUNTER — Ambulatory Visit (HOSPITAL_COMMUNITY)
Admission: RE | Admit: 2014-12-01 | Discharge: 2014-12-01 | Disposition: A | Discharge status: Home / Self Care | Payer: 59 | Source: Ambulatory Visit | Attending: Family Medicine | Admitting: Family Medicine

## 2014-12-01 DIAGNOSIS — Z1231 Encounter for screening mammogram for malignant neoplasm of breast: Secondary | ICD-10-CM | POA: Diagnosis present

## 2015-11-13 ENCOUNTER — Ambulatory Visit (HOSPITAL_COMMUNITY)
Admission: RE | Admit: 2015-11-13 | Discharge: 2015-11-13 | Disposition: A | Discharge status: Home / Self Care | Payer: BLUE CROSS/BLUE SHIELD | Source: Ambulatory Visit | Attending: Family Medicine | Admitting: Family Medicine

## 2015-11-13 ENCOUNTER — Other Ambulatory Visit: Payer: Self-pay | Admitting: Obstetrics and Gynecology

## 2015-11-13 ENCOUNTER — Other Ambulatory Visit (HOSPITAL_COMMUNITY): Payer: Self-pay | Admitting: Family Medicine

## 2015-11-13 DIAGNOSIS — M65841 Other synovitis and tenosynovitis, right hand: Secondary | ICD-10-CM

## 2015-11-13 DIAGNOSIS — M7989 Other specified soft tissue disorders: Secondary | ICD-10-CM | POA: Insufficient documentation

## 2015-11-13 DIAGNOSIS — M79644 Pain in right finger(s): Secondary | ICD-10-CM | POA: Diagnosis present

## 2015-11-13 DIAGNOSIS — Z1231 Encounter for screening mammogram for malignant neoplasm of breast: Secondary | ICD-10-CM

## 2015-12-11 ENCOUNTER — Ambulatory Visit (HOSPITAL_COMMUNITY): Payer: 59

## 2017-05-08 ENCOUNTER — Other Ambulatory Visit (HOSPITAL_COMMUNITY): Payer: Self-pay | Admitting: Internal Medicine

## 2017-05-08 DIAGNOSIS — Z1231 Encounter for screening mammogram for malignant neoplasm of breast: Secondary | ICD-10-CM

## 2017-07-12 ENCOUNTER — Ambulatory Visit (HOSPITAL_COMMUNITY)
Admission: RE | Admit: 2017-07-12 | Discharge: 2017-07-12 | Disposition: A | Discharge status: Home / Self Care | Payer: BLUE CROSS/BLUE SHIELD | Source: Ambulatory Visit | Attending: Internal Medicine | Admitting: Internal Medicine

## 2017-07-12 ENCOUNTER — Other Ambulatory Visit (HOSPITAL_COMMUNITY): Payer: Self-pay | Admitting: Internal Medicine

## 2017-07-12 DIAGNOSIS — S99911A Unspecified injury of right ankle, initial encounter: Secondary | ICD-10-CM

## 2017-07-12 DIAGNOSIS — X58XXXA Exposure to other specified factors, initial encounter: Secondary | ICD-10-CM | POA: Diagnosis not present

## 2017-07-17 ENCOUNTER — Encounter (HOSPITAL_COMMUNITY): Payer: Self-pay | Admitting: *Deleted

## 2017-07-17 ENCOUNTER — Emergency Department (HOSPITAL_COMMUNITY)
Admission: EM | Admit: 2017-07-17 | Discharge: 2017-07-18 | Disposition: A | Discharge status: Other Acute Inpatient Hospital | Payer: BLUE CROSS/BLUE SHIELD | Attending: Emergency Medicine | Admitting: Emergency Medicine

## 2017-07-17 DIAGNOSIS — Z79899 Other long term (current) drug therapy: Secondary | ICD-10-CM | POA: Insufficient documentation

## 2017-07-17 DIAGNOSIS — R5383 Other fatigue: Secondary | ICD-10-CM | POA: Insufficient documentation

## 2017-07-17 DIAGNOSIS — F17210 Nicotine dependence, cigarettes, uncomplicated: Secondary | ICD-10-CM | POA: Diagnosis not present

## 2017-07-17 DIAGNOSIS — F1910 Other psychoactive substance abuse, uncomplicated: Secondary | ICD-10-CM | POA: Diagnosis not present

## 2017-07-17 DIAGNOSIS — T50904A Poisoning by unspecified drugs, medicaments and biological substances, undetermined, initial encounter: Secondary | ICD-10-CM | POA: Insufficient documentation

## 2017-07-17 DIAGNOSIS — R45851 Suicidal ideations: Secondary | ICD-10-CM | POA: Insufficient documentation

## 2017-07-17 DIAGNOSIS — I10 Essential (primary) hypertension: Secondary | ICD-10-CM | POA: Diagnosis not present

## 2017-07-17 LAB — URINALYSIS, ROUTINE W REFLEX MICROSCOPIC
Bilirubin Urine: NEGATIVE
GLUCOSE, UA: NEGATIVE mg/dL
Hgb urine dipstick: NEGATIVE
Ketones, ur: NEGATIVE mg/dL
LEUKOCYTES UA: NEGATIVE
Nitrite: NEGATIVE
PROTEIN: NEGATIVE mg/dL
Specific Gravity, Urine: 1.03 (ref 1.005–1.030)
pH: 5 (ref 5.0–8.0)

## 2017-07-17 LAB — RAPID URINE DRUG SCREEN, HOSP PERFORMED
AMPHETAMINES: NOT DETECTED
Barbiturates: NOT DETECTED
Benzodiazepines: POSITIVE — AB
Cocaine: NOT DETECTED
OPIATES: POSITIVE — AB
Tetrahydrocannabinol: NOT DETECTED

## 2017-07-17 LAB — CBC WITH DIFFERENTIAL/PLATELET
Basophils Absolute: 0 10*3/uL (ref 0.0–0.1)
Basophils Relative: 0 %
EOS ABS: 0.2 10*3/uL (ref 0.0–0.7)
EOS PCT: 2 %
HCT: 41 % (ref 36.0–46.0)
Hemoglobin: 14.2 g/dL (ref 12.0–15.0)
LYMPHS ABS: 3.4 10*3/uL (ref 0.7–4.0)
Lymphocytes Relative: 28 %
MCH: 31.7 pg (ref 26.0–34.0)
MCHC: 34.6 g/dL (ref 30.0–36.0)
MCV: 91.5 fL (ref 78.0–100.0)
MONO ABS: 0.7 10*3/uL (ref 0.1–1.0)
Monocytes Relative: 6 %
Neutro Abs: 7.8 10*3/uL — ABNORMAL HIGH (ref 1.7–7.7)
Neutrophils Relative %: 64 %
PLATELETS: 205 10*3/uL (ref 150–400)
RBC: 4.48 MIL/uL (ref 3.87–5.11)
RDW: 13.2 % (ref 11.5–15.5)
WBC: 12.1 10*3/uL — AB (ref 4.0–10.5)

## 2017-07-17 LAB — COMPREHENSIVE METABOLIC PANEL
ALT: 12 U/L — AB (ref 14–54)
AST: 14 U/L — ABNORMAL LOW (ref 15–41)
Albumin: 4.3 g/dL (ref 3.5–5.0)
Alkaline Phosphatase: 40 U/L (ref 38–126)
Anion gap: 6 (ref 5–15)
BILIRUBIN TOTAL: 0.8 mg/dL (ref 0.3–1.2)
BUN: 18 mg/dL (ref 6–20)
CO2: 25 mmol/L (ref 22–32)
CREATININE: 0.91 mg/dL (ref 0.44–1.00)
Calcium: 8.7 mg/dL — ABNORMAL LOW (ref 8.9–10.3)
Chloride: 105 mmol/L (ref 101–111)
GFR calc non Af Amer: >60 mL/min (ref >60)
Glucose, Bld: 87 mg/dL (ref 65–99)
Potassium: 4 mmol/L (ref 3.5–5.1)
Sodium: 136 mmol/L (ref 135–145)
TOTAL PROTEIN: 7.1 g/dL (ref 6.5–8.1)

## 2017-07-17 LAB — ACETAMINOPHEN LEVEL
ACETAMINOPHEN (TYLENOL), SERUM: 11 ug/mL (ref 10–30)
Acetaminophen (Tylenol), Serum: 15 ug/mL (ref 10–30)

## 2017-07-17 LAB — PREGNANCY, URINE: PREG TEST UR: NEGATIVE

## 2017-07-17 LAB — PROTIME-INR
INR: 1
Prothrombin Time: 13.2 seconds (ref 11.4–15.2)

## 2017-07-17 LAB — SALICYLATE LEVEL
Salicylate Lvl: <7 mg/dL (ref 2.8–30.0)
Salicylate Lvl: <7 mg/dL (ref 2.8–30.0)

## 2017-07-17 LAB — ETHANOL: Alcohol, Ethyl (B): <5 mg/dL (ref <5)

## 2017-07-17 MED ORDER — NALOXONE HCL 0.4 MG/ML IJ SOLN
0.4000 mg | Freq: Once | INTRAMUSCULAR | Status: AC
  Administered 2017-07-17: 0.4 mg via INTRAVENOUS
  Filled 2017-07-17: qty 1

## 2017-07-17 MED ORDER — SODIUM CHLORIDE 0.9 % IV BOLUS (SEPSIS)
1000.0000 mL | Freq: Once | INTRAVENOUS | Status: AC
  Administered 2017-07-17: 1000 mL via INTRAVENOUS

## 2017-07-17 MED ORDER — SODIUM CHLORIDE 0.9 % IV SOLN
INTRAVENOUS | Status: DC
  Administered 2017-07-17: 20:00:00 via INTRAVENOUS

## 2017-07-17 MED ORDER — STERILE WATER FOR INJECTION IJ SOLN
INTRAMUSCULAR | Status: AC
  Administered 2017-07-17: 1.2 mL
  Filled 2017-07-17: qty 10

## 2017-07-17 MED ORDER — ZIPRASIDONE MESYLATE 20 MG IM SOLR
20.0000 mg | Freq: Once | INTRAMUSCULAR | Status: AC
  Administered 2017-07-17: 20 mg via INTRAMUSCULAR
  Filled 2017-07-17: qty 20

## 2017-07-17 NOTE — ED Notes (Signed)
Pt being verbally aggressive with staff yelling obscenities and attempting to get out of bed. Pt yelling that she wants to go smoke a cigarette, have visitors, and to go home. Notified pt this was not an option and she needed to sit back in bed. Pt not listening to multiple staff members including security and [employer] examinerlaw enforcement. MD Clarene DukeMcManus notified of behavior. Pt remains verbally aggressive and has bilateral wrist restraints applied by LEO.

## 2017-07-17 NOTE — ED Triage Notes (Signed)
Pt brought in by [facility] for c/o overdose; pt is lethargic and unable to give today's date; pt states it is October 1989; pt has pinpoint pupils with slurred speech and is able to answer questions at this time; pt states she took an extra one of her xanax and hydrocodone because she was in the car for 5 hours. Pt's daughter and son-in-law told law enforcement on scene that pt was seen today holding a gun to her head

## 2017-07-17 NOTE — ED Notes (Signed)
PC RN Merdis DelayBernice contacted about pt's ingestion of unknown amount of Xanax and Percocet. Per PC monitor for sedation and respiratory depression. Narcan if respiratory depression occurs, if reoccurs start Narcan drip 2/3 dose of correction or intubation. Redraw Tylenol level 4 hours after ingestion and monitor until baseline.
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
## 2017-07-17 NOTE — ED Notes (Signed)
Pt attempting to swallow earrings, nursing staff took out earrings and placed in pt belongings.

## 2017-07-17 NOTE — ED Notes (Signed)
Sitter at bedside, pt is asleep with snoring respirations. O2 95% on RA. Pt awakens to verbal.

## 2017-07-17 NOTE — ED Notes (Signed)
Monitor for 4-6 hours for sedation and respiratory depression [facility]. Reviewed EKG measurements and updated Tylenol and Salicylate levels.

## 2017-07-17 NOTE — ED Notes (Signed)
Pt wanded by security. 

## 2017-07-17 NOTE — ED Notes (Signed)
Pt has bilateral wrists restraints applied by [employer] and is attempting to break R side rail. MD Clarene DukeMcManus notified of pt behavior.

## 2017-07-18 ENCOUNTER — Encounter (HOSPITAL_COMMUNITY): Payer: Self-pay | Admitting: *Deleted

## 2017-07-18 ENCOUNTER — Inpatient Hospital Stay (HOSPITAL_COMMUNITY)
Admission: AD | Admit: 2017-07-18 | Discharge: 2017-07-20 | DRG: 885 | Disposition: A | Discharge status: Home / Self Care | Payer: BLUE CROSS/BLUE SHIELD | Attending: Psychiatry | Admitting: Psychiatry

## 2017-07-18 DIAGNOSIS — F419 Anxiety disorder, unspecified: Secondary | ICD-10-CM | POA: Diagnosis present

## 2017-07-18 DIAGNOSIS — T50904A Poisoning by unspecified drugs, medicaments and biological substances, undetermined, initial encounter: Secondary | ICD-10-CM | POA: Diagnosis not present

## 2017-07-18 DIAGNOSIS — F332 Major depressive disorder, recurrent severe without psychotic features: Secondary | ICD-10-CM | POA: Diagnosis present

## 2017-07-18 DIAGNOSIS — T50901A Poisoning by unspecified drugs, medicaments and biological substances, accidental (unintentional), initial encounter: Secondary | ICD-10-CM

## 2017-07-18 DIAGNOSIS — G4700 Insomnia, unspecified: Secondary | ICD-10-CM | POA: Diagnosis present

## 2017-07-18 DIAGNOSIS — Z833 Family history of diabetes mellitus: Secondary | ICD-10-CM

## 2017-07-18 DIAGNOSIS — G2581 Restless legs syndrome: Secondary | ICD-10-CM | POA: Diagnosis present

## 2017-07-18 DIAGNOSIS — R45851 Suicidal ideations: Secondary | ICD-10-CM | POA: Diagnosis present

## 2017-07-18 DIAGNOSIS — T424X2A Poisoning by benzodiazepines, intentional self-harm, initial encounter: Secondary | ICD-10-CM

## 2017-07-18 DIAGNOSIS — Z8249 Family history of ischemic heart disease and other diseases of the circulatory system: Secondary | ICD-10-CM

## 2017-07-18 DIAGNOSIS — I10 Essential (primary) hypertension: Secondary | ICD-10-CM | POA: Diagnosis present

## 2017-07-18 DIAGNOSIS — Q600 Renal agenesis, unilateral: Secondary | ICD-10-CM

## 2017-07-18 DIAGNOSIS — T1491XA Suicide attempt, initial encounter: Secondary | ICD-10-CM

## 2017-07-18 DIAGNOSIS — T402X2A Poisoning by other opioids, intentional self-harm, initial encounter: Secondary | ICD-10-CM | POA: Diagnosis not present

## 2017-07-18 DIAGNOSIS — F17210 Nicotine dependence, cigarettes, uncomplicated: Secondary | ICD-10-CM

## 2017-07-18 MED ORDER — NICOTINE 21 MG/24HR TD PT24
21.0000 mg | MEDICATED_PATCH | Freq: Every day | TRANSDERMAL | Status: DC
  Administered 2017-07-18 – 2017-07-20 (×3): 21 mg via TRANSDERMAL
  Filled 2017-07-18 (×6): qty 1

## 2017-07-18 MED ORDER — ACETAMINOPHEN 325 MG PO TABS
650.0000 mg | ORAL_TABLET | ORAL | Status: DC | PRN
  Administered 2017-07-18: 650 mg via ORAL
  Filled 2017-07-18: qty 2

## 2017-07-18 MED ORDER — LORAZEPAM 2 MG/ML IJ SOLN
2.0000 mg | Freq: Once | INTRAMUSCULAR | Status: AC
  Administered 2017-07-18: 2 mg via INTRAVENOUS
  Filled 2017-07-18: qty 1

## 2017-07-18 MED ORDER — ALUM & MAG HYDROXIDE-SIMETH 200-200-20 MG/5ML PO SUSP: 30.0000 mL | ORAL | Status: DC | PRN

## 2017-07-18 MED ORDER — HYDROXYZINE HCL 25 MG PO TABS
25.0000 mg | ORAL_TABLET | Freq: Three times a day (TID) | ORAL | Status: DC | PRN
  Administered 2017-07-18: 25 mg via ORAL
  Filled 2017-07-18: qty 1

## 2017-07-18 MED ORDER — ALPRAZOLAM 1 MG PO TABS
1.0000 mg | ORAL_TABLET | Freq: Three times a day (TID) | ORAL | Status: DC
  Administered 2017-07-18 – 2017-07-20 (×7): 1 mg via ORAL
  Filled 2017-07-18 (×8): qty 1

## 2017-07-18 MED ORDER — MAGNESIUM HYDROXIDE 400 MG/5ML PO SUSP: 30.0000 mL | Freq: Every day | ORAL | Status: DC | PRN

## 2017-07-18 MED ORDER — TRAZODONE HCL 50 MG PO TABS: 50.0000 mg | ORAL_TABLET | Freq: Every evening | ORAL | Status: DC | PRN

## 2017-07-18 MED ORDER — ACETAMINOPHEN 325 MG PO TABS
650.0000 mg | ORAL_TABLET | Freq: Four times a day (QID) | ORAL | Status: DC | PRN
  Administered 2017-07-18 – 2017-07-20 (×3): 650 mg via ORAL
  Filled 2017-07-18 (×3): qty 2

## 2017-07-18 MED ORDER — ROPINIROLE HCL 0.5 MG PO TABS
0.5000 mg | ORAL_TABLET | Freq: Every day | ORAL | Status: DC
  Administered 2017-07-18 – 2017-07-19 (×2): 0.5 mg via ORAL
  Filled 2017-07-18 (×2): qty 1
  Filled 2017-07-18: qty 2
  Filled 2017-07-18 (×2): qty 1
  Filled 2017-07-18: qty 2
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
## 2017-07-18 NOTE — ED Notes (Signed)
Pt given warm blanket.
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
## 2017-07-18 NOTE — ED Notes (Signed)
[facility] or [facility].  Spoke with Dr. Effie ShyWentz, changed ace wrap to air cast.  Pt says she can't bear weight.  Called pcp office to verify.  [facility] pt's instructions and said it did not mention no weight bearing.  [facility] and she said they would evaluate and get an order for a wheel chair if needed.

## 2017-07-18 NOTE — ED Notes (Signed)
Poison control states pt is medically cleared and they are closing her account.

## 2017-07-18 NOTE — Progress Notes (Signed)
Per Berneice Heinrichina Tate , [facility], [facility] ; Accepting provider is Donell SievertSpencer Simon; Attending provider is Dr.Cobos.   Patient can arrive at anytime. Number for report is [phone].  Neysa Bonitohristy ,RN notified.   Baldo DaubJolan Ulyess Muto MSW, LCSWA CSW Disposition [phone]

## 2017-07-18 NOTE — ED Notes (Signed)
Pt asleep.

## 2017-07-18 NOTE — ED Notes (Signed)
BH called and states they can not accept her with the ace wrap on her foot. Advised to get ortho evaluate pt.

## 2017-07-18 NOTE — Progress Notes (Signed)
Pt shared she wants to work on getting back home healthy. Pt did not have one thing she could work in relation to what she wanted to work on while she is here. Encouraged pt  to think of goals for the upcoming days to prepare for discharge.

## 2017-07-18 NOTE — ED Notes (Signed)
Pt awoken by [employer]. Pt tearful. Pt states she is tearful b/c shackles hurt and right foot hurts. States she broke it Monday.  Right foot was wrapped in ACE bandage. Pt denies being upset. Pt continues to stay she wants to go home. Denies si/hi/avh. Advised she is committed and will be placed for inpatient admission. Phone given per request.pt states she needs to call her work. Right shackle removed for pt to eat.

## 2017-07-18 NOTE — ED Notes (Signed)
Pt sleeping. Chest rise and fall noted. Sitter at bedside. nad

## 2017-07-18 NOTE — ED Notes (Signed)
I told patient she could have something to eat at breakfast time she stated "You dont like white people, I think I'm better than her, fuck you nigger, they need to go back to slavery time", informed charged nurse T Talbott, to have another nurse take over patient's care, I refused to be verbally abused by patient.

## 2017-07-18 NOTE — ED Notes (Signed)
Patient stated "I need something to drink", offer patient some water she refused.

## 2017-07-18 NOTE — H&P (Signed)
Psychiatric Admission Assessment Adult  Patient Identification: Joanna Simpson MRN:  [id number] Date of Evaluation:  07/18/2017 Chief Complaint:  MDD REC SEV Principal Diagnosis: <principal problem not specified> Diagnosis:   Patient Active Problem List   Diagnosis Date Noted  . MDD (major depressive disorder), recurrent severe, without psychosis ([place]) [F33.2] 07/18/2017  . SUI (stress urinary incontinence, female) [N39.3] 10/21/2013   History of Present Illness: 51 year old female admitted to [facility] after being transferred from [facility] ED following overdose. Patient denies that overdose was intentional. She endorses being prescribed Xanax and recently percocet after a fall which she injured her foot. Reports the combination of both caused a reaction and what was thought to be an intentional overdose. Patient reports she does not recall the events prior to the  Incident and insist that she only ingested the prescribed amounts of her medication. As per previous ED notes, Pt presented to EMS as lethargic and unable to give today's date; pt stated it was October 1989; pt had pinpoint pupils with slurred speech. Pt's family told law enforcement that Pt was seen today holding a gun to her head. Patient denies having a gun during the event.Patient denies current depressive symptoms although she does endorse a history of depression that started after her father passed away 6 years ago. She reports 1 prior SA that occurred at the age of 63 and denies any others. She denies any previous in patient psychiatric admissions or current outpatient care. She denies SI or history thereof. She denies family history of mental health illness. Pt denies current homicidal ideation. She denies alcohol or substance abuse. She reports current medications as Xanax and percocet.Patient states she has been on Xanax for many years . She states her dose is 1 mgr Q 6 hours . She states she takes Hydrocodone as needed , which  was prescribed about a week ago. She endorses past medications as prestige and [place]. During this assessment, patient presents with slurred speech and noticable hand tremors. She seems confused at times and unable to recall events. She is very focused on discharge and continuing Xanax. She continuously state, " I dont even know why I am here."  Patient medical history is remarkable for HTN, irregular heartbeat, genetically born with one kidney, restless leg syndrome.  Patient reports she currently lives with her husband of 16 years. Reports she has 3 kids and currently employed part time.      Associated Signs/Symptoms: Depression Symptoms:  denies depressive symtpoms at this time.  (Hypo) Manic Symptoms:  none  Anxiety Symptoms:  Excessive Worry, Psychotic Symptoms:  none PTSD Symptoms: NA Total Time spent with patient: 1 hour  Past Psychiatric History: Denies prior psychiatric admissions, history of one suicidal gesture at age 69. She describes history of depression, denies history of psychosis, denies history of mania. Reports she has been on Xanax for many years for anxiety.   Is the patient at risk to self? Yes.    Has the patient been a risk to self in the past 6 months? No.  Has the patient been a risk to self within the distant past? Yes.    Is the patient a risk to others? No.  Has the patient been a risk to others in the past 6 months? No.  Has the patient been a risk to others within the distant past? No.    Alcohol Screening: 1. How often do you have a drink containing alcohol?: Monthly or less 2. How many  drinks containing alcohol do you have on a typical day when you are drinking?: 1 or 2 3. How often do you have six or more drinks on one occasion?: Never Preliminary Score: 0 9. Have you or someone else been injured as a result of your drinking?: No 10. Has a relative or friend or a doctor or another health worker been concerned about your drinking or suggested you  cut down?: No Alcohol Use Disorder Identification Test Final Score (AUDIT): 1 Brief Intervention: AUDIT score less than 7 or less-screening does not suggest unhealthy drinking-brief intervention not indicated Substance Abuse History in the last 12 months:  No. Consequences of Substance Abuse: NA Previous Psychotropic Medications: Yes  Psychological Evaluations: No  Past Medical History:  Past Medical History:  Diagnosis Date  . Anxiety   . Hypertension   . Restless leg syndrome   . Tachycardia     Past Surgical History:  Procedure Laterality Date  . CHOLECYSTECTOMY    . ENDOMETRIAL ABLATION    . TUBAL LIGATION     Family History:  Family History  Problem Relation Age of Onset  . Diabetes Mother   . Hypertension Mother   . Cancer Mother        pancreatic  . Diabetes Father   . Hypertension Father   . Cancer Father        colon  . Diabetes Sister    Family Psychiatric  History: None as per patient.  Tobacco Screening: Have you used any form of tobacco in the last 30 days? (Cigarettes, Smokeless Tobacco, Cigars, and/or Pipes): Yes Tobacco use, Select all that apply: 5 or more cigarettes per day Are you interested in Tobacco Cessation Medications?: Yes, will notify MD for an order Counseled patient on smoking cessation including recognizing danger situations, developing coping skills and basic information about quitting provided: Yes Social History:  History  Alcohol Use No     History  Drug Use No    Additional Social History:    Allergies:  No Known Allergies Lab Results:  Results for orders placed or performed during the hospital encounter of 07/17/17 (from the past 48 hour(s))  Acetaminophen level     Status: None   Collection Time: 07/17/17  7:30 PM  Result Value Ref Range   Acetaminophen (Tylenol), Serum 15 10 - 30 ug/mL    Comment:        THERAPEUTIC CONCENTRATIONS VARY SIGNIFICANTLY. A RANGE OF 10-30 ug/mL MAY BE AN EFFECTIVE CONCENTRATION FOR MANY  PATIENTS. HOWEVER, SOME ARE BEST TREATED AT CONCENTRATIONS OUTSIDE THIS RANGE. ACETAMINOPHEN CONCENTRATIONS >150 ug/mL AT 4 HOURS AFTER INGESTION AND >50 ug/mL AT 12 HOURS AFTER INGESTION ARE OFTEN ASSOCIATED WITH TOXIC REACTIONS.   Comprehensive metabolic panel     Status: Abnormal   Collection Time: 07/17/17  7:30 PM  Result Value Ref Range   Sodium 136 135 - 145 mmol/L   Potassium 4.0 3.5 - 5.1 mmol/L   Chloride 105 101 - 111 mmol/L   CO2 25 22 - 32 mmol/L   Glucose, Bld 87 65 - 99 mg/dL   BUN 18 6 - 20 mg/dL   Creatinine, Ser 0.91 0.44 - 1.00 mg/dL   Calcium 8.7 (L) 8.9 - 10.3 mg/dL   Total Protein 7.1 6.5 - 8.1 g/dL   Albumin 4.3 3.5 - 5.0 g/dL   AST 14 (L) 15 - 41 U/L   ALT 12 (L) 14 - 54 U/L   Alkaline Phosphatase 40 38 - 126 U/L  Total Bilirubin 0.8 0.3 - 1.2 mg/dL   GFR calc non Af Amer >60 >60 mL/min   GFR calc Af Amer >60 >60 mL/min    Comment: (NOTE) The eGFR has been calculated using the CKD EPI equation. This calculation has not been validated in all clinical situations. eGFR's persistently <60 mL/min signify possible Chronic Kidney Disease.    Anion gap 6 5 - 15  Ethanol     Status: None   Collection Time: 07/17/17  7:30 PM  Result Value Ref Range   Alcohol, Ethyl (B) <5 <5 mg/dL    Comment:        LOWEST DETECTABLE LIMIT FOR SERUM ALCOHOL IS 5 mg/dL FOR MEDICAL PURPOSES ONLY   Salicylate level     Status: None   Collection Time: 07/17/17  7:30 PM  Result Value Ref Range   Salicylate Lvl <1.6 2.8 - 30.0 mg/dL  CBC with Differential     Status: Abnormal   Collection Time: 07/17/17  7:30 PM  Result Value Ref Range   WBC 12.1 (H) 4.0 - 10.5 K/uL   RBC 4.48 3.87 - 5.11 MIL/uL   Hemoglobin 14.2 12.0 - 15.0 g/dL   HCT 41.0 36.0 - 46.0 %   MCV 91.5 78.0 - 100.0 fL   MCH 31.7 26.0 - 34.0 pg   MCHC 34.6 30.0 - 36.0 g/dL   RDW 13.2 11.5 - 15.5 %   Platelets 205 150 - 400 K/uL   Neutrophils Relative % 64 %   Neutro Abs 7.8 (H) 1.7 - 7.7 K/uL    Lymphocytes Relative 28 %   Lymphs Abs 3.4 0.7 - 4.0 K/uL   Monocytes Relative 6 %   Monocytes Absolute 0.7 0.1 - 1.0 K/uL   Eosinophils Relative 2 %   Eosinophils Absolute 0.2 0.0 - 0.7 K/uL   Basophils Relative 0 %   Basophils Absolute 0.0 0.0 - 0.1 K/uL  Protime-INR     Status: None   Collection Time: 07/17/17  7:30 PM  Result Value Ref Range   Prothrombin Time 13.2 11.4 - 15.2 seconds   INR 1.00   Urine rapid drug screen (hosp performed)     Status: Abnormal   Collection Time: 07/17/17  7:51 PM  Result Value Ref Range   Opiates POSITIVE (A) NONE DETECTED   Cocaine NONE DETECTED NONE DETECTED   Benzodiazepines POSITIVE (A) NONE DETECTED   Amphetamines NONE DETECTED NONE DETECTED   Tetrahydrocannabinol NONE DETECTED NONE DETECTED   Barbiturates NONE DETECTED NONE DETECTED    Comment:        DRUG SCREEN FOR MEDICAL PURPOSES ONLY.  IF CONFIRMATION IS NEEDED FOR ANY PURPOSE, NOTIFY LAB WITHIN 5 DAYS.        LOWEST DETECTABLE LIMITS FOR URINE DRUG SCREEN Drug Class       Cutoff (ng/mL) Amphetamine      1000 Barbiturate      200 Benzodiazepine   606 Tricyclics       301 Opiates          300 Cocaine          300 THC              50   Pregnancy, urine     Status: None   Collection Time: 07/17/17  7:51 PM  Result Value Ref Range   Preg Test, Ur NEGATIVE NEGATIVE    Comment:        THE SENSITIVITY OF THIS METHODOLOGY IS >20 mIU/mL.   Urinalysis,  Routine w reflex microscopic     Status: Abnormal   Collection Time: 07/17/17  7:51 PM  Result Value Ref Range   Color, Urine YELLOW YELLOW   APPearance TURBID (A) CLEAR   Specific Gravity, Urine 1.030 1.005 - 1.030   pH 5.0 5.0 - 8.0   Glucose, UA NEGATIVE NEGATIVE mg/dL   Hgb urine dipstick NEGATIVE NEGATIVE   Bilirubin Urine NEGATIVE NEGATIVE   Ketones, ur NEGATIVE NEGATIVE mg/dL   Protein, ur NEGATIVE NEGATIVE mg/dL   Nitrite NEGATIVE NEGATIVE   Leukocytes, UA NEGATIVE NEGATIVE   RBC / HPF 0-5 0 - 5 RBC/hpf   WBC, UA  0-5 0 - 5 WBC/hpf   Bacteria, UA RARE (A) NONE SEEN   Squamous Epithelial / LPF 0-5 (A) NONE SEEN   Mucous PRESENT   Acetaminophen level     Status: None   Collection Time: 07/17/17  9:24 PM  Result Value Ref Range   Acetaminophen (Tylenol), Serum 11 10 - 30 ug/mL    Comment:        THERAPEUTIC CONCENTRATIONS VARY SIGNIFICANTLY. A RANGE OF 10-30 ug/mL MAY BE AN EFFECTIVE CONCENTRATION FOR MANY PATIENTS. HOWEVER, SOME ARE BEST TREATED AT CONCENTRATIONS OUTSIDE THIS RANGE. ACETAMINOPHEN CONCENTRATIONS >150 ug/mL AT 4 HOURS AFTER INGESTION AND >50 ug/mL AT 12 HOURS AFTER INGESTION ARE OFTEN ASSOCIATED WITH TOXIC REACTIONS.   Salicylate level     Status: None   Collection Time: 07/17/17  9:24 PM  Result Value Ref Range   Salicylate Lvl <8.3 2.8 - 30.0 mg/dL    Blood Alcohol level:  Lab Results  Component Value Date   ETH <5 09/40/7680    Metabolic Disorder Labs:  No results found for: HGBA1C, MPG No results found for: PROLACTIN Lab Results  Component Value Date   CHOL 154 09/16/2009   TRIG 79 09/16/2009   HDL 36 (L) 09/16/2009   CHOLHDL 4.3 Ratio 09/16/2009   VLDL 16 09/16/2009   LDLCALC 102 (H) 09/16/2009    Current Medications: Current Facility-Administered Medications  Medication Dose Route Frequency Provider Last Rate Last Dose  . acetaminophen (TYLENOL) tablet 650 mg  650 mg Oral Q6H PRN Okonkwo, Justina A, NP   650 mg at 07/18/17 1513  . alum & mag hydroxide-simeth (MAALOX/MYLANTA) 200-200-20 MG/5ML suspension 30 mL  30 mL Oral Q4H PRN Okonkwo, Justina A, NP      . hydrOXYzine (ATARAX/VISTARIL) tablet 25 mg  25 mg Oral TID PRN Lu Duffel, Justina A, NP      . magnesium hydroxide (MILK OF MAGNESIA) suspension 30 mL  30 mL Oral Daily PRN Okonkwo, Justina A, NP      . nicotine (NICODERM CQ - dosed in mg/24 hours) patch 21 mg  21 mg Transdermal Daily Nwoko, Herbert Pun I, NP   21 mg at 07/18/17 1514  . traZODone (DESYREL) tablet 50 mg  50 mg Oral QHS PRN Lu Duffel, Justina  A, NP       PTA Medications: Prescriptions Prior to Admission  Medication Sig Dispense Refill Last Dose  . ALPRAZolam (XANAX) 1 MG tablet Take 1 mg by mouth 4 (four) times daily as needed for anxiety.    07/17/2017  . Aspirin-Caffeine (BC FAST PAIN RELIEF PO) Take 1 packet by mouth every 6 (six) hours as needed (For pain.).   07/17/2017  . diphenhydramine-acetaminophen (TYLENOL PM) 25-500 MG TABS tablet Take 2 tablets by mouth at bedtime.   07/16/2017  . HYDROcodone-acetaminophen (NORCO/VICODIN) 5-325 MG tablet Take 1 tablet by mouth every 6 (six)  hours as needed for moderate pain.   07/17/2017  . phentermine 37.5 MG capsule Take 37.5 mg by mouth daily.   07/17/2017  . rOPINIRole (REQUIP) 3 MG tablet Take 3 mg by mouth at bedtime.   0 07/16/2017    Musculoskeletal: Strength & Muscle Tone: within normal limits Gait & Station: normal Patient leans: N/A  Psychiatric Specialty Exam: Physical Exam  Vitals reviewed. Constitutional: She is oriented to person, place, and time.  Neurological: She is alert and oriented to person, place, and time.    Review of Systems  Psychiatric/Behavioral: Positive for depression, memory loss and suicidal ideas. Negative for hallucinations and substance abuse. The patient is nervous/anxious and has insomnia.   All other systems reviewed and are negative.   Blood pressure 131/78, pulse 68, temperature 99.1 F (37.3 C), temperature source Oral, resp. rate 18, height '5\' 5"'  (1.651 m), weight 130 lb (59 kg).Body mass index is 21.63 kg/m.  General Appearance: Fairly Groomed  Eye Contact:  Fair  Speech:  Normal Rate and Slurred  Volume:  Normal  Mood:  Depressed and vaguely dysphoric   Affect:  anxious  Thought Process:  Linear and Descriptions of Associations: Intact  Orientation:  Full (Time, Place, and Person) she is oriented x 3, except for name of [place]   Thought Content:  Logical no hallucinations, no delusions, not internally preoccupied   Suicidal Thoughts:   No denies any suicidal or self injurious ideations  Homicidal Thoughts:  No  denies any homicidal or violent ideations  Memory:   3/3 immediate  3/3 at 3 minutes   Judgement:  Fair  Insight:  Fair  Psychomotor Activity:  tremors  Concentration:  Concentration: Fair and Attention Span: Poor  Recall:  [employer] of Knowledge:  Fair  Language:  Good  Akathisia:  Negative  Handed:  Right  AIMS (if indicated):     Assets:  Desire for Improvement Resilience  ADL's:  Intact  Cognition:  WNL  Sleep:       Treatment Plan Summary: Daily contact with patient to assess and evaluate symptoms and progress in treatment   Treatment Plan/Recommendations: 1. Admit for crisis management and stabilization, estimated length of stay 3-5 days.  2. Medication management to reduce current symptoms to base line and improve the patient's overall level of functioning: See Md's SRATreatment plan.? 3. Treat health problems as indicated.  4. Develop treatment plan to decrease risk of relapse upon discharge and the need for readmission.  5. Psycho-social education regarding relapse prevention and self care.  6. Health care follow up as needed for medical problems.  7. Review, reconcile, and reinstate any pertinent home medications for other health issues where appropriate. 8. Call for consults with hospitalist for any additional specialty patient care services as needed. 9. Begin Clonidine detox protocol for withdrawal symptoms.   Observation Level/Precautions:  15 minute checks  Laboratory:  Per, UDS (+) for benzodiazepines and opiates. Ordered TSH, HgbA1c, lipid panel   Psychotherapy:  Group milieu   Medications:  See MAR  Consultations:  As needed.  Discharge Concerns:  Mood stability & safety  Estimated LOS:3-5 days.  Other:  Admit to the 400-hall.     Physician Treatment Plan for Primary Diagnosis: <principal problem not specified> Long Term Goal(s): Improvement in symptoms so as ready for  discharge  Short Term Goals: Ability to identify changes in lifestyle to reduce recurrence of condition will improve, Ability to verbalize feelings will improve, Compliance with prescribed medications will improve  and Ability to identify triggers associated with substance abuse/mental health issues will improve  Physician Treatment Plan for Secondary Diagnosis: Active Problems:   MDD (major depressive disorder), recurrent severe, without psychosis ([place])  Long Term Goal(s): Improvement in symptoms so as ready for discharge  Short Term Goals: Ability to identify and develop effective coping behaviors will improve and Ability to maintain clinical measurements within normal limits will improve  I certify that inpatient services furnished can reasonably be expected to improve the patient's condition.    Mordecai Maes, NP 7/24/20184:05 PM  I have discussed case with NP and have met with patient Agree with NP assessment  Patient is 51 year old married female, has three adult children, works part time. Lives with husband.  Patient states " I really don't even know why I am here. "  As per chart notes, she presented to ED on 7/23 via EMS, with pinpoint pupils, lethargic, confused, reporting xanax, hydrocodone ingestion. Report is that patient's family stated she had held a gun to her head . States she has no recollection of these events noted in chart notes  She says " I have been doing fine, not depressed, I just got back from a cruise to the [place]". She states she has been on Xanax for many years for anxiety, and denies abusing it. Recently , following a foot injury sustained during a fall , she was started on Hydrocodone .  Patient denies any suicidal ideations .  Denies any recent depression.  Denies/minimizes neuro-vegetative symptoms of depression. Denies hallucinations. She states that she had no intention to overdose, and that " it that is what happened it was completely accidental. I do  not want to die"  Denies prior psychiatric admissions, history of one suicidal gesture at age 52. She describes history of depression, denies history of psychosis, denies history of mania. Reports she has been on Xanax for many years for anxiety.  Medical history - states that she was born with one kidney, HTN. Recent foot injury sustained during a fall  Patient states she has been on Xanax for many years . She states her dose is 1 mgr Q 6 hours . She states she takes Hydrocodone as needed , which was prescribed about a week ago.   Dx- S/P Overdose . BZD dependence.   Plan- inpatient admission.   We discussed above issues with patient at length, and reviewed potential dangerous interactions regarding opiate and benzodiazepine use concomitantly. Patient is currently resistant/reluctant to taper off /detox off BZDs. States " I am going to continue Xanax the way I have been taking it when I get back home".  She agrees to D/C Opiate analgesics, which she states she has been on for only a few days and has not been taking daily, so that withdrawal would be unlikely  .  I have reviewed with inpatient pharmacist, and have reviewed [place], confirming that patient is prescribed Xanax 1 mgr QID, last prescribed #120 on 6/20.   Continue Xanax 1 mgr  At TID, hold if sedated .

## 2017-07-18 NOTE — Tx Team (Signed)
Initial Treatment Plan 07/18/2017 4:51 PM Joanna PerchesBeverly A Abt [id number]    PATIENT STRESSORS: Marital or family conflict Medication change or noncompliance   PATIENT STRENGTHS: Average or above average intelligence Communication skills Motivation for treatment/growth Supportive family/friends   PATIENT IDENTIFIED PROBLEMS: At risk for suicide  "Communication with husband"  "Start taking care of me instead of everybody"                 DISCHARGE CRITERIA:  Ability to meet basic life and health needs Improved stabilization in mood, thinking, and/or behavior Motivation to continue treatment in a less acute level of care Need for constant or close observation no longer present Verbal commitment to aftercare and medication compliance  PRELIMINARY DISCHARGE PLAN: Outpatient therapy Return to previous living arrangement  PATIENT/FAMILY INVOLVEMENT: This treatment plan has been presented to and reviewed with the patient, Joanna Simpson.  The patient and family have been given the opportunity to ask questions and make suggestions.  Carleene OverlieMiddleton, Siddhi Dornbush P, RN 07/18/2017, 4:51 PM
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
## 2017-07-18 NOTE — ED Provider Notes (Signed)
5:28 AM Patient is becoming more agitated. Will give ativan.   Pricilla LovelessGoldston, Kasidi Shanker, MD 07/18/17 [phone]

## 2017-07-19 LAB — LIPID PANEL
Cholesterol: 151 mg/dL (ref 0–200)
HDL: 28 mg/dL — ABNORMAL LOW (ref >40)
LDL CALC: 99 mg/dL (ref 0–99)
Total CHOL/HDL Ratio: 5.4 RATIO
Triglycerides: 122 mg/dL (ref <150)
VLDL: 24 mg/dL (ref 0–40)

## 2017-07-19 LAB — TSH: TSH: 0.742 u[IU]/mL (ref 0.350–4.500)
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
## 2017-07-19 NOTE — Progress Notes (Addendum)
Patient ID: Joanna Simpson, female   DOB: 2/5/Tora Perches1967, 51 y.o.   MRN: [id number]  Pt currently presents with a flat affect and anxious behavior. Pt reports to Clinical research associatewriter that their goal is to "go home tomorrow." Pt states "I am excited to go see my grandchildren after my husband comes to meet the doctor tomorrow." Pt reports good sleep with current medication regimen. Pt uses wheelchair to ambulate, reports pain 6/10 in R ankle.   Pt provided with medications per providers orders. Pt's labs and vitals were monitored throughout the night. Pt given a 1:1 about emotional and mental status. Pt supported and encouraged to express concerns and questions. Pt educated on medications.  Pt's safety ensured with 15 minute and environmental checks. Pt currently denies SI/HI and A/V hallucinations. Pt verbally agrees to seek staff if SI/HI or A/VH occurs and to consult with staff before acting on any harmful thoughts. Will continue POC.

## 2017-07-19 NOTE — [facility] Group Notes (Signed)
[facility] 07/19/2017 1:15pm  Type of Therapy: Mental Health Association Presentation  Participation Level: Active  Participation Quality: Attentive  Affect: Appropriate  Cognitive: Oriented  Insight: Developing/Improving  Engagement in Therapy: Engaged  Modes of Intervention: Discussion, Education and Socialization  Summary of Progress/Problems: Mental Health Association (MHA) Speaker came to talk about his personal journey with living with a mental health diagnosis.The pt processed ways by which to relate to the speaker. MHA speaker provided handouts and educational information pertaining to groups and services offered by the [facility]. Pt was engaged in speaker's presentation and was receptive to resources provided.    Vito BackersLynn B. Beverely PaceBryant, MSW, [facility] CenterCSWA 07/19/2017 3:34 PM

## 2017-07-19 NOTE — Progress Notes (Signed)
[facility] MD Progress Note  07/19/2017 10:58 AM Joanna Simpson  MRN:  [id number] Subjective:  Patient reports " I am doing great, I need to go home so I can take care of my job and see my family". States she has no recollection of events that led to admission and does not remember having threatened to shoot self . States " I have no reason to want to kill myself". As session progressed she did report recent increased marital tension recently. Denies medication side effects. Objective : I have discussed case with treatment team and have met with patient . Patient is alert, attentive,calm and in no acute distress or discomfort. She denies significant depression, denies neuro-vegetative symptoms, and denies any suicidal ideations. Initially presented slightly irritable, but affect improved as session progressed . As noted, she did speak about some recent marital tension and worries her husband may want to leave her .  With patient's express consent I spoke with her husband on phone. Husband corroborates recent events leading to admission, and states she has recently been more irritable with him. One stressor has been patient's recent foot injury, due to which her mobility has been compromised and is therefore more dependent on him.  We reviewed danger of combining BZD such as xanax, which she states she has been on for many years, and opiates , which she states she was recently prescribed for foot pain. Remains reluctant to taper off Xanax, but states she will avoid opiates after discharge . Patient interested in having a family meeting with husband  Principal Problem: Overdose, patient states accidental, suicidal ideations Diagnosis:   Patient Active Problem List   Diagnosis Date Noted  . MDD (major depressive disorder), recurrent severe, without psychosis ([place]) [F33.2] 07/18/2017  . Drug overdose [T50.901A]   . SUI (stress urinary incontinence, female) [N39.3] 10/21/2013   Total Time spent with  patient: 20 minutes   Past Medical History:  Past Medical History:  Diagnosis Date  . Anxiety   . Hypertension   . Restless leg syndrome   . Tachycardia     Past Surgical History:  Procedure Laterality Date  . CHOLECYSTECTOMY    . ENDOMETRIAL ABLATION    . TUBAL LIGATION     Family History:  Family History  Problem Relation Age of Onset  . Diabetes Mother   . Hypertension Mother   . Cancer Mother        pancreatic  . Diabetes Father   . Hypertension Father   . Cancer Father        colon  . Diabetes Sister    Social History:  History  Alcohol Use No     History  Drug Use No    Social History   Social History  . Marital status: Married    Spouse name: N/A  . Number of children: N/A  . Years of education: N/A   Social History Main Topics  . Smoking status: Current Every Day Smoker    Packs/day: 0.50    Types: Cigarettes  . Smokeless tobacco: Never Used  . Alcohol use No  . Drug use: No  . Sexual activity: Yes   Other Topics Concern  . None   Social History Narrative  . None   Additional Social History:   Sleep: Good  Appetite:  improved   Current Medications: Current Facility-Administered Medications  Medication Dose Route Frequency Provider Last Rate Last Dose  . acetaminophen (TYLENOL) tablet 650 mg  650 mg Oral Q6H PRN Lu Duffel,  Justina A, NP   650 mg at 07/18/17 1513  . ALPRAZolam Duanne Moron) tablet 1 mg  1 mg Oral TID Jeremiah Tarpley, Myer Peer, MD   1 mg at 07/19/17 9211  . alum & mag hydroxide-simeth (MAALOX/MYLANTA) 200-200-20 MG/5ML suspension 30 mL  30 mL Oral Q4H PRN Okonkwo, Justina A, NP      . hydrOXYzine (ATARAX/VISTARIL) tablet 25 mg  25 mg Oral TID PRN Lu Duffel, Justina A, NP   25 mg at 07/18/17 2155  . magnesium hydroxide (MILK OF MAGNESIA) suspension 30 mL  30 mL Oral Daily PRN Okonkwo, Justina A, NP      . nicotine (NICODERM CQ - dosed in mg/24 hours) patch 21 mg  21 mg Transdermal Daily Lindell Spar I, NP   21 mg at 07/19/17 9417  .  rOPINIRole (REQUIP) tablet 0.5 mg  0.5 mg Oral QHS Laverle Hobby, PA-C   0.5 mg at 07/18/17 2321    Lab Results:  Results for orders placed or performed during the hospital encounter of 07/18/17 (from the past 48 hour(s))  TSH     Status: None   Collection Time: 07/19/17  6:32 AM  Result Value Ref Range   TSH 0.742 0.350 - 4.500 uIU/mL    Comment: Performed by a 3rd Generation assay with a functional sensitivity of <=0.01 uIU/mL. Performed at [facility], [place] [address], [place], [place] [address]     Blood Alcohol level:  Lab Results  Component Value Date   ETH <5 48/18/5631    Metabolic Disorder Labs: No results found for: HGBA1C, MPG No results found for: PROLACTIN Lab Results  Component Value Date   CHOL 154 09/16/2009   TRIG 79 09/16/2009   HDL 36 (L) 09/16/2009   CHOLHDL 4.3 Ratio 09/16/2009   VLDL 16 09/16/2009   LDLCALC 102 (H) 09/16/2009    Physical Findings: AIMS: Facial and Oral Movements Muscles of Facial Expression: None, normal Lips and Perioral Area: None, normal Jaw: None, normal Tongue: None, normal,Extremity Movements Upper (arms, wrists, hands, fingers): None, normal Lower (legs, knees, ankles, toes): None, normal, Trunk Movements Neck, shoulders, hips: None, normal, Overall Severity Severity of abnormal movements (highest score from questions above): None, normal Incapacitation due to abnormal movements: None, normal Patient's awareness of abnormal movements (rate only patient's report): No Awareness, Dental Status Current problems with teeth and/or dentures?: No Does patient usually wear dentures?: No  CIWA:    COWS:     Musculoskeletal: Strength & Muscle Tone: within normal limits Gait & Station: normal Patient leans: N/A  Psychiatric Specialty Exam: Physical Exam  ROS denies drowsiness, denies chest pain or shortness of breath, denies nausea, denies vomiting, no fever , no chills   Blood pressure 112/78, pulse 83,  temperature 98.4 F (36.9 C), temperature source Oral, resp. rate 16, height _0  (1.651 m), weight 59 kg (130 lb).Body mass index is 21.63 kg/m.  General Appearance: improved grooming   Eye Contact:  Good  Speech:  Normal Rate  Volume:  Normal  Mood:  minimizes depression, states she feels "OK"  Affect:  mildly irritable, constricted, but reactive. Affect improves as session progresses   Thought Process:  Linear and Descriptions of Associations: Intact  Orientation:  Other:  fully alert and attentive   Thought Content:  denies hallucinations, no delusions   Suicidal Thoughts:  No denies any suicidal plan or intention at this time and contracts for safety   Homicidal Thoughts:  No denies any homicidal or violent ideations  towards anyone   Memory:  recent and remote grossly intact   Judgement:  Fair  Insight:  Fair  Psychomotor Activity:  Normal- mobilizes in wheel chair due to foot injury, pain. No tremors, no diaphoresis, no acute distress or restlessness   Concentration:  Concentration: Good and Attention Span: Good  Recall:  Good  Fund of Knowledge:  Good  Language:  Good  Akathisia:  No  Handed:  Right  AIMS (if indicated):     Assets:  Communication Skills Resilience  ADL's:  Intact  Cognition:  WNL  Sleep:  Number of Hours: 5.75   Assessment - at this time patient denies any suicidal ideations and minimizes depression. States she has no recollection of recent events and has had no suicidal ideations prior to admission. She has been on Xanax for many years for anxiety, and is reluctant to taper down or off. She is more insightful about the likely role that recent opiate analgesic medication may have played . Husband states patient has been exhibiting some increased irritability recently and corroborates marital tension has been an issue. They both express interest in family meeting  Treatment Plan Summary: Daily contact with patient to assess and evaluate symptoms and  progress in treatment, Medication management, Plan inpatient admission and medications as below Encourage group and milieu participation to work on coping skills and symptom reduction Treatment team working on disposition planning options  Family meeting with husband scheduled for tomorrow 1 PM Continue Xanax 1 mgr TID for anxiety. Have encouraged patient to consider gradually tapering dose down.   Jenne Campus, MD 07/19/2017, 10:58 AM

## 2017-07-19 NOTE — Progress Notes (Signed)
Patient seen ambulating well on wheelchair. Complained of Restless Leg Syndrome states that she can't sleep unless she got something for her RLS. Got order for that. Offered support and encouragement as needed. Denies SI/HI, AH/VH at this time. Endorses depression of 5/10 and anxiety 6/10. In bed at this time. Will continue to monitor patient.

## 2017-07-19 NOTE — Progress Notes (Signed)
D: Patient has been observed in the day room interacting with her peers.  She rates her depression and anxiety as a 7.  She denies any hopelessness.  Her goal today is to "work on going home."  Patient has injured her right foot and is experiencing some pain.  She does not appear to be in any physical distress.  Patient is sleeping and eating well.  Her mood has improved since yesterday; she is not as tearful today.  Patient has no recollection of what happened before she was admitted.  Her only complaint is that "it is cold in my room."  Patient denies any thoughts of self harm.  She is focused on discharge. A: Continue to monitor medication management and MD orders.  Safety checks continued every 15 minutes per protocol.  Offer support and encouragement as needed. R: Patient is receptive to staff; her behavior is appropriate.

## 2017-07-19 NOTE — Progress Notes (Signed)
Recreation Therapy Notes  Date: 07/19/2017 Time: 9:30am Location: [address] Dayroom  Group Topic: Stress Management  Goal Area(s) Addresses:  Patient will verbalize importance of using healthy stress management.  Patient will identify positive emotions associated with healthy stress management.   Behavioral Response: Engaged  Intervention: Stress Management  Activity : Calming Color Relaxation Visualization . Recreation Therapy Intern introduced the stress management technique of visualization. Recreation Therapy Intern read a script that allowed patients to mentally visualize different colors. Recreation Therapy Intern played calming flute music. Patients were to follow along as script was read to engage in the activity.  Education: Stress Management, Discharge Planning.   Education Outcome: Acknowledges edcuation  Clinical Observations/Feedback: Pt attended group.  Marvell Fullerachel Meyer, Recreation Therapy Intern  Caroll RancherMarjette Harlan Ervine, LRT/CTRS

## 2017-07-20 LAB — HEMOGLOBIN A1C
HEMOGLOBIN A1C: 5.1 % (ref 4.8–5.6)
MEAN PLASMA GLUCOSE: 100 mg/dL

## 2017-07-20 MED ORDER — NICOTINE 21 MG/24HR TD PT24: 21.0000 mg | MEDICATED_PATCH | Freq: Every day | TRANSDERMAL | 0 refills | Status: DC

## 2017-07-20 NOTE — [facility] Suicide Risk Assessment (Signed)
[facility] INPATIENT:  Family/Significant Other Suicide Prevention Education  Suicide Prevention Education:  Education Completed; Joanna Simpson (husband), has been identified by the patient as the family member/significant other with whom the patient will be residing, and identified as the person(s) who will aid the patient in the event of a mental health crisis (suicidal ideations/suicide attempt).  With written consent from the patient, the family member/significant other has been provided the following suicide prevention education, prior to the and/or following the discharge of the patient.  The suicide prevention education provided includes the following:  Suicide risk factors  Suicide prevention and interventions  National Suicide Hotline telephone number  [facility] assessment telephone number  [facility] Emergency Assistance 911  [facility] and/or Residential Mobile Crisis Unit telephone number  Request made of family/significant other to:  Remove weapons (e.g., guns, rifles, knives), all items previously/currently identified as safety concern.    Remove drugs/medications (over-the-counter, prescriptions, illicit drugs), all items previously/currently identified as a safety concern.  The family member/significant other verbalizes understanding of the suicide prevention education information provided.  The family member/significant other agrees to remove the items of safety concern listed above.  SPE was completed in person during a family session with MD, pt, and pt's husband. Pt's husband states that he is comfortable with pt discharging and has been stated that he will be supportive in getting pt to her outpatient appointments.  Joanna Simpson, MSW, LCSWA 07/20/2017, 2:56 PM

## 2017-07-20 NOTE — Progress Notes (Signed)
Discharge Note:  Patient discharged home with husband.  Patient denied SI and HI, contracts for safety.  Denied A/V hallucinations.  Suicide prevention information given and discussed with patient who stated she understood and had no questions.  Patient stated she received all her belongings.  All required discharge information given to patient at discharge.

## 2017-07-20 NOTE — [facility] Group Notes (Signed)
[facility] LCSW Group Therapy 07/20/2017 1:15pm  Type of Therapy: Group Therapy- Balance in Life  Participation Level: Pt invited. Did not attend. Pt was in a family session during the time of the group.  Donnelly StagerLynn Eriyah Fernando, MSW, LCSWA 07/20/2017 3:05 PM

## 2017-07-20 NOTE — Progress Notes (Signed)
  [facility] :  Will you be returning to the same living situation after discharge:  Yes,  pt returning home. At discharge, do you have transportation home?: Yes,  pt's husband will transport. Do you have the ability to pay for your medications: Yes,  pt has insurance.  Release of information consent forms completed and in the chart;  Patient's signature needed at discharge.  Patient to Follow up at: Follow-up Information    [facility] Follow up on 07/25/2017.   Specialty:  Behavioral Health Why:  Intensive outpatient assessment appointment @8 :45am. Please call the office if you need to cancel/reschedule your appointment. Thank you! Contact information: [address] [place] [id number] [phone]          Next level of care provider has access to [facility] Link:yes  Safety Planning and Suicide Prevention discussed: Yes,  with pt and with pt's husband.  Have you used any form of tobacco in the last 30 days? (Cigarettes, Smokeless Tobacco, Cigars, and/or Pipes): Yes  Has patient been referred to the Quitline?: Patient refused referral  Patient has been referred for addiction treatment: Yes  Jonathon JordanLynn B Ramatoulaye Pack, MSW, LCSWA 07/20/2017, 4:31 PM

## 2017-07-20 NOTE — [facility] Suicide Risk Assessment (Addendum)
[facility]   Principal Problem: S/P Overdose  Discharge Diagnoses:  Patient Active Problem List   Diagnosis Date Noted  . MDD (major depressive disorder), recurrent severe, without psychosis (HCC) [F33.2] 07/18/2017  . Drug overdose [T50.901A]   . SUI (stress urinary incontinence, female) [N39.3] 10/21/2013    Total Time spent with patient: 45 minutes  Musculoskeletal: Strength & Muscle Tone: within normal limits- mobilizing in wheel chair due to foot injury Gait & Station: normal Patient leans: N/A  Psychiatric Specialty Exam: ROS denies headache, no chest pain, no shortness of breath, no vomiting , no fever   Blood pressure 114/75, pulse 70, temperature 97.8 F (36.6 C), temperature source Oral, resp. rate 18, height 5\' 5"  (1.651 m), weight 59 kg (130 lb).Body mass index is 21.63 kg/m.  General Appearance: Well Groomed  Eye Contact::  Good  Speech:  Normal Rate409  Volume:  Normal  Mood:  improving, less depressed   Affect:  appropriate, more reactive   Thought Process:  Linear and Descriptions of Associations: Intact  Orientation:  Full (Time, Place, and Person)  Thought Content:  denies hallucinations, no delusions, not internally preoccupied   Suicidal Thoughts:  No denies any suicidal or self injurious ideations, denies any homicidal or violent ideations, future oriented   Homicidal Thoughts:  No  Memory:  recent and remote grossly intact   Judgement:  Other:  improving   Insight:  improving   Psychomotor Activity:  Normal  Concentration:  Good  Recall:  Good  Fund of Knowledge:Good  Language: Good  Akathisia:  Negative  Handed:  Right  AIMS (if indicated):     Assets:  Communication Skills Desire for Improvement Resilience  Sleep:  Number of Hours: 5.25  Cognition: WNL  ADL's:  Intact   Mental Status Per Nursing Assessment::   On Admission:  Suicidal ideation indicated by others, Suicide plan, Plan includes specific time, place,  or method  Demographic Factors:  51 year old married female, lives with husband, employed   Loss Factors: Recent fall/foot injury, some marital tension  Historical Factors: Denies history of suicidal attempts or self injurious behaviors in the past   Risk Reduction Factors:   Sense of responsibility to family, Employed, Living with another person, especially a relative and Positive coping skills or problem solving skills  Continued Clinical Symptoms:  At this time patient is alert, attentive, oriented x 3, well groomed, mood is improving , affect is more reactive, no thought disorder, no suicidal or self injurious ideations, no homicidal ideations , no hallucinations, no delusions , not internally preoccupied, future oriented . 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 No disruptive or agitated behaviors on unit, calm on approach Today , prior to discharge, we had family meeting with patient, husband, CSW, Clinical research associatewriter . As reviewed with patient, husband, on day of admission patient had become angry, belligerent, and had  broken items at home with her crutch. Patient states she has no recollection of these events, and expresses increased  insight that she was  in a blackout . She was apologetic to her husband  and stated she intended to avoid opiates as she has realized these medications had been involved in her recent episode. She also states she intends to gradually taper off Xanax .  They discussed recent increased marital tension and both expressed motivation in working on improving relationship. Husband stated he is in agreement with patient being  discharged today and will continue to provide daily support for patient.  Husband states that firearms have been removed from household  Patient is future oriented, and expressed desire to work on marriage, expressed interest in IOP level of care, and in tapering off Xanax  gradually .   Cognitive Features That Contribute To Risk:  No gross cognitive deficits noted upon discharge. Is alert , attentive, and oriented x 3   Suicide Risk:  Mild:  Suicidal ideation of limited frequency, intensity, duration, and specificity.  There are no identifiable plans, no associated intent, mild dysphoria and related symptoms, good self-control (both objective and subjective assessment), few other risk factors, and identifiable protective factors, including available and accessible social support.  Follow-up Information    [facility] Follow up.   Contact information: [address] [place] [id number] [phone]           Plan Of Care/Follow-up recommendations:  Activity:  as tolerated  Diet:  regular Tests:  NA Other:  See below  Patient is requesting discharge and there are no further grounds for involuntary commitment at this time. Patient is leaving unit in good spirits. Plans to return home with husband  She states she has Xanax prescription at home and will discuss further gradual taper with her prescribing MD. She is aware of risk of withdrawal symptoms if she stops medication abruptly. Advised regarding risk of sedation and overdose regarding combination of BZDs and Opiates- patient states she intends not to take any further opiates .  Patient advised not to drive if feeling sedated, drowsy, or if taking  medications that cause sedation. She plans to follow up with orthopedist for foot pain management .  Craige CottaFernando A Vonda Harth, MD 07/20/2017, 2:03 PM

## 2017-07-20 NOTE — Progress Notes (Signed)
D:  Patient's self inventory sheet, patient has fair sleep, no sleep medication given.  Good appetite, normal energy level, good concentration.  Rated depression 6, anxiety 7.  Denied withdrawals.  Denied SI.  Denied physical problems.  Physical pain, foot, worst pain #6.  Goal is discharge and game plan.  Plans to attend meetings and services available.  Does have discharge plans. A:  Medications administered per MD orders.  Emotional support and encouragement given patient. R:  Denied SI and HI, contracts for safety.  Denied A/V hallucinations.  Safety maintained with 15 minute checks.

## 2017-07-20 NOTE — [facility] Group Notes (Signed)
The focus of this group is to educate the patient on the purpose and policies of crisis stabilization and provide a format to answer questions about their admission.  The group details unit policies and expectations of patients while admitted.  Patient did not attend 0900 nurse education orientation group this morning.  Patient stayed in room.  

## 2017-07-20 NOTE — Discharge Summary (Signed)
Physician Discharge Summary Note  Patient:  Joanna Simpson is an 51 y.o., female MRN:  [id number] DOB:  05/29/1966 Patient phone:  [phone] (home)  Patient address:   [address] [place] [address],  Total Time spent with patient: 30 minutes  Date of Admission:  07/18/2017 Date of Discharge: 07/20/17  Reason for Admission:  Unintentional overdose  Principal Problem: MDD (major depressive disorder), recurrent severe, without psychosis [facility](HCC) Discharge Diagnoses: Patient Active Problem List   Diagnosis Date Noted  . MDD (major depressive disorder), recurrent severe, without psychosis (HCC) [F33.2] 07/18/2017  . Drug overdose [T50.901A]   . SUI (stress urinary incontinence, female) [N39.3] 10/21/2013    Past Psychiatric History: Denies prior psychiatric admissions, history of one suicidal gesture at age 51. She describes history of depression, denies history of psychosis, denies history of mania. Reports she has been on Xanax for many years for anxiety.  Past Medical History:  Past Medical History:  Diagnosis Date  . Anxiety   . Hypertension   . Restless leg syndrome   . Tachycardia     Past Surgical History:  Procedure Laterality Date  . CHOLECYSTECTOMY    . ENDOMETRIAL ABLATION    . TUBAL LIGATION     Family History:  Family History  Problem Relation Age of Onset  . Diabetes Mother   . Hypertension Mother   . Cancer Mother        pancreatic  . Diabetes Father   . Hypertension Father   . Cancer Father        colon  . Diabetes Sister    Family Psychiatric  History: None reported Social History:  History  Alcohol Use No     History  Drug Use No    Social History   Social History  . Marital status: Married    Spouse name: N/A  . Number of children: N/A  . Years of education: N/A   Social History Main Topics  . Smoking status: Current Every Day Smoker    Packs/day: 0.50    Types: Cigarettes  . Smokeless tobacco: Never Used  . Alcohol use No   . Drug use: No  . Sexual activity: Yes   Other Topics Concern  . None   Social History Narrative  . None    Hospital Course:  [facility] an unintentional overdose of mixed medications of Xanax and Percocet. Patient's percocet was stopped and was put on a taper down dose for the Xanax and upon discharge is going to 1 mg PO BID PRN. Patient was cooperative and pleasant during her stay. Patient attended group therapy. Patient plans to not take any other controlled substances while on Xanax. Reviewed danger of combining BZD such as xanax, which she states she has been on for many years, and opiates , which she states she was recently prescribed for foot pain. Remains reluctant to taper off Xanax, but states she will avoid opiates after discharge . She has a plan to continue tapering off of her Xanax. Patient denies any SI/HI/AVH and will be discharged home with her husband. Husband came in and had family meeting with Dr. Jama Flavorsobos and CSW staff.  Physical Findings: AIMS: Facial and Oral Movements Muscles of Facial Expression: None, normal Lips and Perioral Area: None, normal Jaw: None, normal Tongue: None, normal,Extremity Movements Upper (arms, wrists, hands, fingers): None, normal Lower (legs, knees, ankles, toes): None, normal, Trunk Movements Neck, shoulders, hips: None, normal, Overall Severity Severity of abnormal movements (  highest score from questions above): None, normal Incapacitation due to abnormal movements: None, normal Patient's awareness of abnormal movements (rate only patient's report): No Awareness, Dental Status Current problems with teeth and/or dentures?: No Does patient usually wear dentures?: No  CIWA:    COWS:     Musculoskeletal: Strength & Muscle Tone: within normal limits Gait & Station: unsteady, due to ankle injury Patient leans: N/A  Psychiatric Specialty Exam: Physical Exam  Nursing note and vitals reviewed. Constitutional: She is  oriented to person, place, and time. She appears well-developed and well-nourished.  Cardiovascular: Normal rate.   Musculoskeletal: Normal range of motion.  Neurological: She is alert and oriented to person, place, and time.    Review of Systems  Constitutional: Negative.   HENT: Negative.   Eyes: Negative.   Respiratory: Negative.   Cardiovascular: Negative.   Gastrointestinal: Negative.   Genitourinary: Negative.   Musculoskeletal: Negative.  Joint pain: ankle pain due to recent injury.  Skin: Negative.   Neurological: Negative.   Endo/Heme/Allergies: Negative.     Blood pressure 114/75, pulse 70, temperature 97.8 F (36.6 C), temperature source Oral, resp. rate 18, height 5\' 5"  (1.651 m), weight 59 kg (130 lb).Body mass index is 21.63 kg/m.  General Appearance: Casual and Fairly Groomed  Eye Contact:  Good  Speech:  Clear and Coherent  Volume:  Normal  Mood:  Euthymic  Affect:  Appropriate  Thought Process:  Coherent and Descriptions of Associations: Intact  Orientation:  Full (Time, Place, and Person)  Thought Content:  WDL  Suicidal Thoughts:  No  Homicidal Thoughts:  No  Memory:  Immediate;   Good Recent;   Good  Judgement:  Good  Insight:  Good  Psychomotor Activity:  Normal  Concentration:  Concentration: Good and Attention Span: Good  Recall:  Good  Fund of Knowledge:  Good  Language:  Good  Akathisia:  No  Handed:  Right  AIMS (if indicated):     Assets:  Financial Resources/Insurance Social Support Transportation  ADL's:  Intact  Cognition:  WNL  Sleep:  Number of Hours: 5.25     Have you used any form of tobacco in the last 30 days? (Cigarettes, Smokeless Tobacco, Cigars, and/or Pipes): Yes  Has this patient used any form of tobacco in the last 30 days? (Cigarettes, Smokeless Tobacco, Cigars, and/or Pipes) Yes, A prescription for an FDA-approved tobacco cessation medication was given to patient  Blood Alcohol level:  Lab Results  Component Value  Date   ETH <5 07/17/2017    Metabolic Disorder Labs:  Lab Results  Component Value Date   HGBA1C 5.1 07/19/2017   MPG 100 07/19/2017   No results found for: PROLACTIN Lab Results  Component Value Date   CHOL 151 07/19/2017   TRIG 122 07/19/2017   HDL 28 (L) 07/19/2017   CHOLHDL 5.4 07/19/2017   VLDL 24 07/19/2017   LDLCALC 99 07/19/2017   LDLCALC 102 (H) 09/16/2009    See Psychiatric Specialty Exam and Suicide Risk Assessment completed by Attending Physician prior to discharge.  Discharge destination:  Home  Is patient on multiple antipsychotic therapies at discharge:  No   Has Patient had three or more failed trials of antipsychotic monotherapy by history:  No  Recommended Plan for Multiple Antipsychotic Therapies: NA   Allergies as of 07/20/2017   No Known Allergies     Medication List    STOP taking these medications   BC FAST PAIN RELIEF PO   diphenhydramine-acetaminophen 25-500 MG  Tabs tablet Commonly known as:  TYLENOL PM   HYDROcodone-acetaminophen 5-325 MG tablet Commonly known as:  NORCO/VICODIN   phentermine 37.5 MG capsule     TAKE these medications     Indication  ALPRAZolam 1 MG tablet Commonly known as:  XANAX Take 1 mg by mouth 4 (four) times daily as needed for anxiety.  Indication:  Feeling Anxious   nicotine 21 mg/24hr patch Commonly known as:  NICODERM CQ - dosed in mg/24 hours Place 1 patch (21 mg total) onto the skin daily.  Indication:  Nicotine Addiction   rOPINIRole 3 MG tablet Commonly known as:  REQUIP Take 3 mg by mouth at bedtime.  Indication:  Restless Leg Syndrome      Follow-up Information    [facility] Follow up on 07/25/2017.   Specialty:  Behavioral Health Why:  Intensive outpatient assessment appointment @8 :45am. Please call the office if you need to cancel/reschedule your appointment. Thank you! Contact information: [address] [place]  [id number] [phone]          Follow-up recommendations:  Continue activity as tolerated. Continue diet as recommended by your PCP. Ensure to keep all appointments with outpatient providers.  Comments:  Patient is instructed prior to discharge to: Take all medications as prescribed by his/her mental healthcare provider. Report any adverse effects and or reactions from the medicines to his/her outpatient provider promptly. Patient has been instructed & cautioned: To not engage in alcohol and or illegal drug use while on prescription medicines. In the event of worsening symptoms, patient is instructed to call the crisis hotline, 911 and or go to the nearest ED for appropriate evaluation and treatment of symptoms. To follow-up with his/her primary care provider for your other medical issues, concerns and or health care needs.    Signed: Gerlene Burdockravis B Money, FNP 07/20/2017, 3:05 PM   Patient seen, Suicide Assessment Completed.  Disposition Plan Reviewed

## 2017-07-24 ENCOUNTER — Telehealth: Payer: Self-pay | Admitting: Orthopedic Surgery

## 2017-07-24 NOTE — Telephone Encounter (Signed)
Patient called back this morning.  I had called and left a message for her on 07-18-17.  She was referred to our office by her PCP.  I told her that Dr. Romeo AppleHarrison was out of the office this week.  I offered an appointment with Dr. Hilda LiasKeeling but she refused.  She did not want to  wait until Dr. Mort SawyersHarrison's next opening which is around 08-08-17.  She said she would contact her PCP [facility].    I have faxed this conversation to pt's PCP.

## 2017-08-10 ENCOUNTER — Ambulatory Visit (HOSPITAL_COMMUNITY): Payer: Self-pay

## 2017-11-27 ENCOUNTER — Encounter: Payer: 59 | Admitting: Obstetrics and Gynecology

## 2018-07-04 ENCOUNTER — Other Ambulatory Visit: Payer: Self-pay | Admitting: Obstetrics and Gynecology

## 2018-07-16 ENCOUNTER — Ambulatory Visit: Payer: BLUE CROSS/BLUE SHIELD | Admitting: Women's Health

## 2018-07-16 ENCOUNTER — Encounter: Payer: Self-pay | Admitting: Women's Health

## 2018-07-16 VITALS — BP 123/84 | HR 101 | Ht 65.0 in | Wt 159.3 lb

## 2018-07-16 DIAGNOSIS — N6320 Unspecified lump in the left breast, unspecified quadrant: Secondary | ICD-10-CM

## 2018-07-16 NOTE — Progress Notes (Signed)
   GYN VISIT Patient name: Joanna Simpson MRN [id number]  Date of birth: 10/07/1966 Chief Complaint:   breast issue (felt knot lt breast)  History of Present Illness:   Joanna Simpson is a 52 y.o. G3P3 Caucasian female being seen today for report of Lt breast mass x 1 month, no changes. Last mammogram 2015 was normal.     Patient's last menstrual period was 06/16/2018. The current method of family planning is tubal ligation. Last pap 2014. Results were:  neg w/ -HRHPV Review of Systems:   Pertinent items are noted in HPI Denies fever/chills, dizziness, headaches, visual disturbances, fatigue, shortness of breath, chest pain, abdominal pain, vomiting, abnormal vaginal discharge/itching/odor/irritation, problems with periods, bowel movements, urination, or intercourse unless otherwise stated above.  Pertinent History Reviewed:  Reviewed past medical,surgical, social, obstetrical and family history.  Reviewed problem list, medications and allergies. Physical Assessment:   Vitals:   07/16/18 1452  BP: 123/84  Pulse: (!) 101  Weight: 159 lb 4.8 oz (72.3 kg)  Height: 5\' 5"  (1.651 m)  Body mass index is 26.51 kg/m.       Physical Examination:   General appearance: alert, well appearing, and in no distress  Mental status: alert, oriented to person, place, and time  Skin: warm & dry   Cardiovascular: normal heart rate noted  Respiratory: normal respiratory effort, no distress  Breasts: Rt breast appears normal, no suspicious masses, no skin or nipple changes or axillary nodes, Lt breast ~1cm non-mobile sore mass at 1 o'clock 212fb from nipple.   Abdomen: soft, non-tender   Pelvic: examination not indicated  Extremities: no edema   No results found for this or any previous visit (from the past 24 hour(s)).  Assessment & Plan:  1) Lt breast mass> diagnostic mammogram and bilateral u/s 7/30 @ 2pm at [facility], be there at 1:45pm, no lotion/powder/deoderant/perfume  2) Past due for pap>  schedule  Meds: No orders of the defined types were placed in this encounter.   Orders Placed This Encounter  Procedures  . MM DIAG BREAST TOMO BILATERAL  . US BREAST LTD UNI LEFT INC AXILLA  . US BREAST LTD UNI RIGHT INC AXILLA    Return in about 1 month (around 08/16/2018) for Pap & physical.  Cheral MarkerKimberly R Caedin Mogan CNM, [facility]-BC 07/16/2018 3:24 PM

## 2018-07-16 NOTE — Patient Instructions (Signed)
Breast [facility], be there at 1:45pm, no lotion/deoderant/powder/perfume that day

## 2018-07-24 ENCOUNTER — Ambulatory Visit (HOSPITAL_COMMUNITY)
Admission: RE | Admit: 2018-07-24 | Discharge: 2018-07-24 | Disposition: A | Discharge status: Home / Self Care | Payer: BLUE CROSS/BLUE SHIELD | Source: Ambulatory Visit | Attending: Women's Health | Admitting: Women's Health

## 2018-07-24 ENCOUNTER — Encounter (HOSPITAL_COMMUNITY): Payer: Self-pay

## 2018-07-24 DIAGNOSIS — N6320 Unspecified lump in the left breast, unspecified quadrant: Secondary | ICD-10-CM

## 2018-08-16 ENCOUNTER — Other Ambulatory Visit (HOSPITAL_COMMUNITY)
Admission: RE | Admit: 2018-08-16 | Discharge: 2018-08-16 | Disposition: A | Discharge status: Home / Self Care | Payer: BLUE CROSS/BLUE SHIELD | Source: Ambulatory Visit | Attending: Obstetrics & Gynecology | Admitting: Obstetrics & Gynecology

## 2018-08-16 ENCOUNTER — Ambulatory Visit (INDEPENDENT_AMBULATORY_CARE_PROVIDER_SITE_OTHER): Payer: BLUE CROSS/BLUE SHIELD | Admitting: Women's Health

## 2018-08-16 ENCOUNTER — Encounter: Payer: Self-pay | Admitting: Women's Health

## 2018-08-16 VITALS — BP 132/82 | HR 70 | Ht 65.0 in | Wt 161.8 lb

## 2018-08-16 DIAGNOSIS — R32 Unspecified urinary incontinence: Secondary | ICD-10-CM

## 2018-08-16 DIAGNOSIS — Z01419 Encounter for gynecological examination (general) (routine) without abnormal findings: Secondary | ICD-10-CM | POA: Diagnosis present

## 2018-08-16 NOTE — Patient Instructions (Signed)

## 2018-08-16 NOTE — Progress Notes (Signed)
   WELL-WOMAN EXAMINATION Patient name: Joanna Simpson MRN [id number]  Date of birth: 06/04/1966 Chief Complaint:   Gynecologic Exam (pap/physcial)  History of Present Illness:   Joanna PerchesBeverly A Christmas is a 52 y.o. G3P3 Caucasian female being seen today for a routine well-woman exam. Had mammo 7/30 for tender Lt breast mass, mammo and u/s did show a 7mm simple cyst in same area, Bi-rads 2. States area is still tender. Is trying to quit smoking-wearing a patch and vaping. Drinks 2 cups of coffee q am.  Current complaints: urinary incontinence, not generally w/ jumping/sneezing/coughing etc, can just leak down her leg, some w/ urge. Hasn't tried kegels.   PCP: Phillips OdorGolding      does not desire labs, will do w/ PCP No LMP recorded. Patient has had an ablation. The current method of family planning is tubal ligation Last pap 2014. Results were: normal Last mammogram: 07/24/18. Results were: normal Last colonoscopy: never. Results were: n/a  Review of Systems:   Pertinent items are noted in HPI Denies any headaches, blurred vision, fatigue, shortness of breath, chest pain, abdominal pain, abnormal vaginal discharge/itching/odor/irritation, problems with periods, bowel movements, urination, or intercourse unless otherwise stated above. Pertinent History Reviewed:  Reviewed past medical,surgical, social and family history.  Reviewed problem list, medications and allergies. Physical Assessment:   Vitals:   08/16/18 1052  BP: 132/82  Pulse: 70  Weight: 161 lb 12.8 oz (73.4 kg)  Height: 5\' 5"  (1.651 m)  Body mass index is 26.92 kg/m.        Physical Examination:   General appearance - well appearing, and in no distress  Mental status - alert, oriented to person, place, and time  Psych:  She has a normal mood and affect  Skin - warm and dry, normal color, no suspicious lesions noted  Chest - effort normal, all lung fields clear to auscultation bilaterally  Heart - normal rate and regular  rhythm  Neck:  midline trachea, no thyromegaly or nodules  Breasts - breasts appear normal, no suspicious masses, no skin or nipple changes or  axillary nodes. No distinct mass felt today, still slightly tender to palpation.   Abdomen - soft, nontender, nondistended, no masses or organomegaly  Pelvic - VULVA: normal appearing vulva with no masses, tenderness or lesions  VAGINA: normal appearing vagina with normal color and discharge, no lesions, no evidence of cystocele  CERVIX: normal appearing cervix without discharge or lesions, no CMT  Thin prep pap is done w/ HR HPV cotesting  UTERUS: uterus is felt to be normal size, shape, consistency and nontender   ADNEXA: No adnexal masses or tenderness noted.  Extremities:  No swelling or varicosities noted  No results found for this or any previous visit (from the past 24 hour(s)).  Assessment & Plan:  1) Well-Woman Exam  2) Urinary incontinece> try Kegels, if not improving, make appt w/ MD  3) Lt breast tenderness> 7mm simple cyst in that area on u/s 7/30. Try cutting back caffeine/smoking  Labs/procedures today: pap  Mammogram next July or sooner if problems Colonoscopy- to schedule  No orders of the defined types were placed in this encounter.   Follow-up: Return in about 1 year (around 08/17/2019) for Physical.  Cheral MarkerKimberly R Gleason Ardoin CNM, [facility]NP-BC 08/16/2018 11:48 AM

## 2018-08-17 LAB — CYTOLOGY - PAP
Diagnosis: NEGATIVE
HPV: NOT DETECTED

## 2019-01-23 ENCOUNTER — Encounter (HOSPITAL_COMMUNITY): Payer: Self-pay | Admitting: Behavioral Health

## 2019-01-23 ENCOUNTER — Other Ambulatory Visit: Payer: Self-pay | Admitting: Registered Nurse

## 2019-01-23 ENCOUNTER — Encounter: Payer: Self-pay | Admitting: Behavioral Health

## 2019-01-23 ENCOUNTER — Other Ambulatory Visit: Payer: Self-pay | Admitting: Behavioral Health

## 2019-01-23 ENCOUNTER — Inpatient Hospital Stay (HOSPITAL_COMMUNITY)
Admission: RE | Admit: 2019-01-23 | Discharge: 2019-02-01 | DRG: 885 | Disposition: A | Discharge status: Home / Self Care | Payer: 59 | Attending: Psychiatry | Admitting: Psychiatry

## 2019-01-23 ENCOUNTER — Ambulatory Visit (HOSPITAL_COMMUNITY)
Admit: 2019-01-23 | Discharge: 2019-01-23 | Disposition: A | Discharge status: Home / Self Care | Payer: Managed Care, Other (non HMO) | Attending: Psychiatry | Admitting: Psychiatry

## 2019-01-23 ENCOUNTER — Other Ambulatory Visit: Payer: Self-pay

## 2019-01-23 ENCOUNTER — Encounter (HOSPITAL_COMMUNITY): Payer: Self-pay

## 2019-01-23 DIAGNOSIS — I10 Essential (primary) hypertension: Secondary | ICD-10-CM | POA: Diagnosis present

## 2019-01-23 DIAGNOSIS — Z79899 Other long term (current) drug therapy: Secondary | ICD-10-CM

## 2019-01-23 DIAGNOSIS — Z9181 History of falling: Secondary | ICD-10-CM | POA: Diagnosis not present

## 2019-01-23 DIAGNOSIS — Z800 Family history of malignant neoplasm of digestive organs: Secondary | ICD-10-CM | POA: Diagnosis not present

## 2019-01-23 DIAGNOSIS — F1320 Sedative, hypnotic or anxiolytic dependence, uncomplicated: Secondary | ICD-10-CM | POA: Diagnosis present

## 2019-01-23 DIAGNOSIS — R079 Chest pain, unspecified: Secondary | ICD-10-CM | POA: Diagnosis not present

## 2019-01-23 DIAGNOSIS — F13288 Sedative, hypnotic or anxiolytic dependence with other sedative, hypnotic or anxiolytic-induced disorder: Secondary | ICD-10-CM | POA: Diagnosis not present

## 2019-01-23 DIAGNOSIS — R443 Hallucinations, unspecified: Secondary | ICD-10-CM

## 2019-01-23 DIAGNOSIS — Z8249 Family history of ischemic heart disease and other diseases of the circulatory system: Secondary | ICD-10-CM | POA: Diagnosis not present

## 2019-01-23 DIAGNOSIS — G4700 Insomnia, unspecified: Secondary | ICD-10-CM | POA: Diagnosis present

## 2019-01-23 DIAGNOSIS — G2581 Restless legs syndrome: Secondary | ICD-10-CM | POA: Diagnosis present

## 2019-01-23 DIAGNOSIS — Z915 Personal history of self-harm: Secondary | ICD-10-CM

## 2019-01-23 DIAGNOSIS — Z833 Family history of diabetes mellitus: Secondary | ICD-10-CM | POA: Diagnosis not present

## 2019-01-23 DIAGNOSIS — G454 Transient global amnesia: Secondary | ICD-10-CM | POA: Diagnosis present

## 2019-01-23 DIAGNOSIS — F4310 Post-traumatic stress disorder, unspecified: Secondary | ICD-10-CM | POA: Diagnosis present

## 2019-01-23 DIAGNOSIS — Z23 Encounter for immunization: Secondary | ICD-10-CM | POA: Diagnosis not present

## 2019-01-23 DIAGNOSIS — F333 Major depressive disorder, recurrent, severe with psychotic symptoms: Secondary | ICD-10-CM | POA: Diagnosis present

## 2019-01-23 DIAGNOSIS — F419 Anxiety disorder, unspecified: Secondary | ICD-10-CM | POA: Diagnosis present

## 2019-01-23 DIAGNOSIS — R55 Syncope and collapse: Secondary | ICD-10-CM

## 2019-01-23 DIAGNOSIS — F4312 Post-traumatic stress disorder, chronic: Secondary | ICD-10-CM | POA: Diagnosis not present

## 2019-01-23 DIAGNOSIS — F1020 Alcohol dependence, uncomplicated: Secondary | ICD-10-CM | POA: Diagnosis present

## 2019-01-23 DIAGNOSIS — R45851 Suicidal ideations: Secondary | ICD-10-CM

## 2019-01-23 DIAGNOSIS — F17210 Nicotine dependence, cigarettes, uncomplicated: Secondary | ICD-10-CM | POA: Diagnosis present

## 2019-01-23 LAB — URINALYSIS, COMPLETE (UACMP) WITH MICROSCOPIC
Bilirubin Urine: NEGATIVE
Glucose, UA: NEGATIVE mg/dL
HGB URINE DIPSTICK: NEGATIVE
Ketones, ur: NEGATIVE mg/dL
Leukocytes, UA: NEGATIVE
Nitrite: NEGATIVE
Protein, ur: NEGATIVE mg/dL
Specific Gravity, Urine: 1.027 (ref 1.005–1.030)
pH: 5 (ref 5.0–8.0)

## 2019-01-23 LAB — LIPID PANEL
Cholesterol: 196 mg/dL (ref 0–200)
HDL: 36 mg/dL — ABNORMAL LOW (ref >40)
LDL Cholesterol: 123 mg/dL — ABNORMAL HIGH (ref 0–99)
Total CHOL/HDL Ratio: 5.4 RATIO
Triglycerides: 186 mg/dL — ABNORMAL HIGH (ref <150)
VLDL: 37 mg/dL (ref 0–40)

## 2019-01-23 LAB — CBC
HCT: 44.3 % (ref 36.0–46.0)
Hemoglobin: 14.6 g/dL (ref 12.0–15.0)
MCH: 31.6 pg (ref 26.0–34.0)
MCHC: 33 g/dL (ref 30.0–36.0)
MCV: 95.9 fL (ref 80.0–100.0)
PLATELETS: 212 10*3/uL (ref 150–400)
RBC: 4.62 MIL/uL (ref 3.87–5.11)
RDW: 13.7 % (ref 11.5–15.5)
WBC: 9.5 10*3/uL (ref 4.0–10.5)
nRBC: 0 % (ref 0.0–0.2)

## 2019-01-23 LAB — COMPREHENSIVE METABOLIC PANEL
ALT: 14 U/L (ref 0–44)
AST: 13 U/L — ABNORMAL LOW (ref 15–41)
Albumin: 4.1 g/dL (ref 3.5–5.0)
Alkaline Phosphatase: 44 U/L (ref 38–126)
Anion gap: 9 (ref 5–15)
BUN: 26 mg/dL — ABNORMAL HIGH (ref 6–20)
CHLORIDE: 105 mmol/L (ref 98–111)
CO2: 23 mmol/L (ref 22–32)
Calcium: 9 mg/dL (ref 8.9–10.3)
Creatinine, Ser: 0.78 mg/dL (ref 0.44–1.00)
GFR calc Af Amer: >60 mL/min (ref >60)
GFR calc non Af Amer: >60 mL/min (ref >60)
Glucose, Bld: 107 mg/dL — ABNORMAL HIGH (ref 70–99)
POTASSIUM: 3.5 mmol/L (ref 3.5–5.1)
Sodium: 137 mmol/L (ref 135–145)
Total Bilirubin: 0.4 mg/dL (ref 0.3–1.2)
Total Protein: 6.8 g/dL (ref 6.5–8.1)

## 2019-01-23 LAB — AMMONIA: Ammonia: 24 umol/L (ref 9–35)

## 2019-01-23 LAB — RAPID URINE DRUG SCREEN, HOSP PERFORMED
Amphetamines: NOT DETECTED
Barbiturates: NOT DETECTED
Benzodiazepines: POSITIVE — AB
Cocaine: NOT DETECTED
Opiates: NOT DETECTED
Tetrahydrocannabinol: NOT DETECTED

## 2019-01-23 LAB — ETHANOL: Alcohol, Ethyl (B): <10 mg/dL (ref <10)

## 2019-01-23 LAB — TSH: TSH: 4.475 u[IU]/mL (ref 0.350–4.500)

## 2019-01-23 MED ORDER — PNEUMOCOCCAL VAC POLYVALENT 25 MCG/0.5ML IJ INJ: 0.5000 mL | INJECTION | INTRAMUSCULAR | Status: DC

## 2019-01-23 MED ORDER — VITAMIN B-1 100 MG PO TABS
100.0000 mg | ORAL_TABLET | Freq: Every day | ORAL | Status: DC
  Administered 2019-01-23 – 2019-02-01 (×10): 100 mg via ORAL
  Filled 2019-01-23 (×12): qty 1

## 2019-01-23 MED ORDER — INFLUENZA VAC SPLIT QUAD 0.5 ML IM SUSY
0.5000 mL | PREFILLED_SYRINGE | INTRAMUSCULAR | Status: AC
  Administered 2019-01-24: 0.5 mL via INTRAMUSCULAR
  Filled 2019-01-23: qty 0.5

## 2019-01-23 MED ORDER — TRAZODONE HCL 50 MG PO TABS
50.0000 mg | ORAL_TABLET | Freq: Every evening | ORAL | Status: DC | PRN
  Administered 2019-01-29 – 2019-01-30 (×2): 50 mg via ORAL
  Filled 2019-01-23 (×2): qty 1

## 2019-01-23 MED ORDER — HYDROXYZINE HCL 25 MG PO TABS
25.0000 mg | ORAL_TABLET | ORAL | Status: DC | PRN
  Administered 2019-01-23 – 2019-01-27 (×2): 25 mg via ORAL
  Filled 2019-01-23 (×2): qty 1

## 2019-01-23 MED ORDER — FOLIC ACID 1 MG PO TABS
1.0000 mg | ORAL_TABLET | Freq: Every day | ORAL | Status: DC
  Administered 2019-01-23 – 2019-02-01 (×10): 1 mg via ORAL
  Filled 2019-01-23 (×12): qty 1

## 2019-01-23 MED ORDER — ROPINIROLE HCL 1 MG PO TABS
3.0000 mg | ORAL_TABLET | Freq: Every day | ORAL | Status: DC
  Administered 2019-01-23 – 2019-01-31 (×9): 3 mg via ORAL
  Filled 2019-01-23 (×10): qty 3

## 2019-01-23 MED ORDER — QUETIAPINE FUMARATE 100 MG PO TABS
100.0000 mg | ORAL_TABLET | Freq: Every day | ORAL | Status: DC
  Administered 2019-01-23: 100 mg via ORAL
  Filled 2019-01-23 (×4): qty 1

## 2019-01-23 MED ORDER — ALPRAZOLAM 1 MG PO TABS
1.0000 mg | ORAL_TABLET | Freq: Four times a day (QID) | ORAL | Status: DC | PRN
  Administered 2019-01-23 – 2019-01-30 (×22): 1 mg via ORAL
  Filled 2019-01-23 (×22): qty 1

## 2019-01-23 MED ORDER — NICOTINE 21 MG/24HR TD PT24
21.0000 mg | MEDICATED_PATCH | Freq: Every day | TRANSDERMAL | Status: DC
  Administered 2019-01-23 – 2019-02-01 (×10): 21 mg via TRANSDERMAL
  Filled 2019-01-23 (×13): qty 1

## 2019-01-23 MED ORDER — ALPRAZOLAM 1 MG PO TABS: 1.0000 mg | ORAL_TABLET | Freq: Three times a day (TID) | ORAL | Status: DC | PRN

## 2019-01-23 MED ORDER — QUETIAPINE FUMARATE 25 MG PO TABS
25.0000 mg | ORAL_TABLET | Freq: Once | ORAL | Status: AC
  Administered 2019-01-23: 25 mg via ORAL
  Filled 2019-01-23 (×2): qty 1
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
## 2019-01-23 NOTE — Progress Notes (Signed)
Patient ID: Joanna Simpson, female   DOB: 08-15-1966, 53 y.o.   MRN: [id number]  PTA medications: phentermine, requip and xanax verified with [facility] on [address] in [place]. Pt up to date on prescriptions.

## 2019-01-23 NOTE — Progress Notes (Signed)
D: Pt was in hallway upon initial approach.  Pt presents with anxious, depressed affect and mood.  When asked about her day, she reports she has "been sleeping."  When asked what her goal is, pt states "I want sleep and I'm hungry."  Pt denies SI/HI, denies hallucinations, denies pain.  Pt has been visible in milieu interacting with peers and staff appropriately.     A: Introduced self to pt.  Met with pt 1:1.  Actively listened to pt and offered support and encouragement.  Medications administered per order.  PRN medication administered for anxiety.  Fall prevention techniques reviewed with pt and she verbalized understanding.  Q15 minute safety checks maintained.  R: Pt is safe on the unit.  Pt is compliant with medications.  Pt verbally contracts for safety.  Will continue to monitor and assess.
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
## 2019-01-23 NOTE — Tx Team (Signed)
Initial Treatment Plan 01/23/2019 1:19 PM Joanna Simpson IWL:[id number]    PATIENT STRESSORS: Health problems Marital or family conflict Other: psychosis, blackouts   PATIENT STRENGTHS: Communication skills General fund of knowledge Supportive family/friends   PATIENT IDENTIFIED PROBLEMS: Depression  Suicidal ideation - "I don't want to be here anymore."  Psychosis - "I see a little man with a hat and coat on. My daughter saw him too right before my dad died"  "I want to feel better"  "I want to have a life again"             DISCHARGE CRITERIA:  Ability to meet basic life and health needs Improved stabilization in mood, thinking, and/or behavior Reduction of life-threatening or endangering symptoms to within safe limits Verbal commitment to aftercare and medication compliance  PRELIMINARY DISCHARGE PLAN: Outpatient therapy Return to previous living arrangement  PATIENT/FAMILY INVOLVEMENT: This treatment plan has been presented to and reviewed with the patient, Joanna Simpson.The patient and family have been given the opportunity to ask questions and make suggestions.  Kennyth Lose, RN 01/23/2019, 1:19 PM

## 2019-01-23 NOTE — H&P (Signed)
Behavioral Health Medical Screening Exam  Joanna Simpson is an 53 y.o. female with a history of anxiety, panic attacks and hallucinations. She presented to [facility] as an walk in and was accompany by her husband. Patient is requesting help due to her hallucinations getting worse over the past few weeks. During her assessment she denies suicidal ideations and homicidal ideation. Patient does admit to hallucinations during that period she attempts to do harm to herself. Patient states she blackout and does not remember trying to harm self. During her assessment the patient states the blackouts began in 2011 after her farther past away. She explained that she sees a man with a hat on in a trench coat and she believes that he tells her to do things. The patient states that the last episode happened on Sunday which she attempted to drown herself. The patient husband stated that she also try to gouge eyeballs out. The patient also said this week "I was at the camper and I busted the glass and try to put my head through the window to cut myself." The patient continues to say "I don't remember doing any of those things." The patient state I see the marks on me, so I know it is real."   Total Time spent with patient: 30 minutes      Psychiatric Specialty Exam: Physical Exam  Constitutional: She is oriented to person, place, and time. She appears well-developed and well-nourished.  Neck: Normal range of motion. Neck supple.  Respiratory: Effort normal and breath sounds normal.  Musculoskeletal: Normal range of motion.  Neurological: She is alert and oriented to person, place, and time. She has normal reflexes.  Skin: Skin is warm and dry.  Psychiatric: Judgment and thought content normal.    Review of Systems  Neurological:       Patient reports during the episodes when she is trying to hurt or kill herself she doesn't remember.    Psychiatric/Behavioral: Positive for depression and hallucinations.  Negative for memory loss, substance abuse and suicidal ideas. The patient is nervous/anxious. The patient does not have insomnia.   All other systems reviewed and are negative.   There were no vitals taken for this visit.There is no height or weight on file to calculate BMI.  General Appearance: Well Groomed  Eye Contact:  Fair  Speech:  Clear and Coherent and Normal Rate  Volume:  Normal  Mood:  Anxious, Depressed and Hopeless  Affect:  Appropriate  Thought Process:  Coherent  Orientation:  Full (Time, Place, and Person)  Thought Content:  Logical  Suicidal Thoughts:  No  Homicidal Thoughts:  No  Memory:  Immediate;   Fair Recent;   Fair Remote;   Fair  Judgement:  Fair  Insight:  Fair  Psychomotor Activity:  Tremor  Concentration: Concentration: Fair  Recall:  Poor  Fund of Knowledge:Fair  Language: Good  Akathisia:  No  Handed:  Right  AIMS (if indicated):     Assets:    Sleep:       Musculoskeletal: Strength & Muscle Tone: within normal limits Gait & Station: normal Patient leans: N/A  There were no vitals taken for this visit.  Recommendations: The patient does meet carteria for inpatient services.  Based on my evaluation the patient does not appear to have an emergency medical condition.  Catalina Gravel, NP 01/23/2019, 10:45 AM

## 2019-01-23 NOTE — BHH Suicide Risk Assessment (Signed)
[facility]   Nursing information obtained from:  Patient Demographic factors:  Unemployed, Caucasian, Low socioeconomic status Current Mental Status:  Suicidal ideation indicated by patient Loss Factors:  Loss of significant relationship, Decline in physical health Historical Factors:  Prior suicide attempts, Impulsivity Risk Reduction Factors:  Sense of responsibility to family, Living with another person, especially a relative  Total Time spent with patient: 30 minutes Principal Problem: <principal problem not specified> Diagnosis:  Active Problems:   MDD (major depressive disorder), recurrent, severe, with psychosis (HCC)  Subjective Data: Patient is seen and examined.  Patient is a 53 year old female with a past psychiatric history reportedly for depression, anxiety, probable alcohol dependence and Xanax dependence who presented as a walk-in to the behavioral health hospital on 01/23/2019.  The patient presented with her husband because of recent "blackouts" as well as what was reported to self-injurious behavior.  She also admitted to hallucinations.  She was accompanied by her husband.  The patient has a previous psychiatric admission to our facility in 2018 after an intentional overdose.  She is followed by Dr. Evelene CroonKaur locally.  She is prescribed Xanax 1 mg p.o. 4 times daily and has basically received 120 tablets on a regular basis for an extended period of time.  The patient also reported having had either dissociative episodes or amnestic episodes where she could not remember what occurred over a period of time.  She also had self-injurious behaviors during that time.  During the blackout.  She would scratch her eyes, scratch her chest, put her head under water in the bathtub.  She had no memory of these events.  She admitted to helplessness, hopelessness and worthlessness as well as insomnia.  She stated that she needed something "strong" because she does not sleep.   She stated her visual hallucinations was of a little man who urged her to harm herself.  Her current list of medications included Xanax, phentermine, and Requip.  She denied any recent falls, but there was concern that this could be delirium from alcohol and/or Xanax, the potential for liver related issues with increased ammonia, and as well the possibility of previous falls leading to head injury.  Because of the possibility of multiple medical problems that could be the origin of this it was decided to obtain a CT scan of the head acutely as well as send her to the hospital to get her lab work done including ammonia etc.  She was admitted to the hospital for evaluation and stabilization.  Continued Clinical Symptoms:    The "Alcohol Use Disorders Identification Test", Guidelines for Use in Primary Care, Second Edition.  World Science writerHealth Organization [facility](WHO). Score between 0-7:  no or low risk or alcohol related problems. Score between 8-15:  moderate risk of alcohol related problems. Score between 16-19:  high risk of alcohol related problems. Score 20 or above:  warrants further diagnostic evaluation for alcohol dependence and treatment.   CLINICAL FACTORS:   Severe Anxiety and/or Agitation Depression:   Aggression Anhedonia Comorbid alcohol abuse/dependence Delusional Hopelessness Impulsivity Insomnia Alcohol/Substance Abuse/Dependencies More than one psychiatric diagnosis Currently Psychotic Previous Psychiatric Diagnoses and Treatments   Musculoskeletal: Strength & Muscle Tone: within normal limits Gait & Station: normal Patient leans: N/A  Psychiatric Specialty Exam: Physical Exam  Nursing note and vitals reviewed. Constitutional: She is oriented to person, place, and time. She appears well-developed and well-nourished.  HENT:  Head: Normocephalic and atraumatic.  Respiratory: Effort normal.  Neurological: She is alert and  oriented to person, place, and time.    ROS  Blood  pressure (!) 164/100, pulse 81, temperature 97.9 F (36.6 C), temperature source Oral, resp. rate 18, height 5\' 5"  (1.651 m), weight 69.9 kg, SpO2 100 %.Body mass index is 25.63 kg/m.  General Appearance: Disheveled  Eye Contact:  Fair  Speech:  Normal Rate  Volume:  Normal  Mood:  Anxious, Dysphoric and Irritable  Affect:  Constricted  Thought Process:  Disorganized and Descriptions of Associations: Circumstantial  Orientation:  Full (Time, Place, and Person)  Thought Content:  Hallucinations: Auditory Visual  Suicidal Thoughts:  Yes.  without intent/plan  Homicidal Thoughts:  No  Memory:  Immediate;   Fair Recent;   Fair Remote;   Fair  Judgement:  Impaired  Insight:  Lacking  Psychomotor Activity:  Increased  Concentration:  Concentration: Poor and Attention Span: Poor  Recall:  [employer] of Knowledge:  Fair  Language:  Fair  Akathisia:  Negative  Handed:  Right  AIMS (if indicated):     Assets:  Desire for Improvement Financial Resources/Insurance Housing Resilience  ADL's:  Intact  Cognition:  WNL  Sleep:         COGNITIVE FEATURES THAT CONTRIBUTE TO RISK:  Thought constriction (tunnel vision)    SUICIDE RISK:   Mild:  Suicidal ideation of limited frequency, intensity, duration, and specificity.  There are no identifiable plans, no associated intent, mild dysphoria and related symptoms, good self-control (both objective and subjective assessment), few other risk factors, and identifiable protective factors, including available and accessible social support.  PLAN OF CARE: Patient is seen and examined.  Patient is a 53 year old female with the above-stated past psychiatric history who was admitted secondary to worsening mood, increased anxiety, psychotic symptoms, amnestic and dissociative episodes.  She was admitted directly, and currently we do not have any laboratories on her.  Given the above and concern for delirium from multiple sources I am going to send her  to the hospital to get her lab work done, and as well we will get a CT scan of her head just to make sure that there have not been any falls or trauma.  I am also going to look at her ammonia to make sure that is not playing a role in this given the alcohol and the Xanax.  I am going to give her Seroquel 25 mg now to calm her down, and if she tolerates that we will give her 100 mg p.o. nightly because of her concern for an inability to sleep.  Much of this depends on what her laboratories reveal.  I do think that she is probably in some degree of withdrawal as well.  I have gone on and written for her Xanax currently 1 mg p.o. 4 times daily in order to prevent any kind of seizures or worsening behaviors in case she is run out of this medication at home.  If she had run out that may be contributing to some of the symptoms.  We will hope to wean her from that, but on her hospitalization in 2018 she was discharged on the 1 mg 4 times a day.  We will get collateral information from her husband to find out additional things with regard to the alcohol intake, her compliance with medications, and how confused she may have gotten at home.  I am going to put her on seizure precautions as well as fall precautions.  If necessary we may have to put her on  one-to-one if she is unstable on her feet.  I certify that inpatient services furnished can reasonably be expected to improve the patient's condition.   Antonieta PertGreg Lawson Kyleen Villatoro, MD 01/23/2019, 3:34 PM

## 2019-01-23 NOTE — BH Assessment (Signed)
Assessment Note  Joanna Simpson is a 53 y.o. female who presented as a voluntary walk-in to [facility] with complaint of increased depression, anxiety, ''black-outs'' (accompanied by self-injurious behavior), and hallucination.  Pt was last assessed by TTS in 2018.  At that time, Pt presented following suicide attempt by overdose.  Pt was accompanied by husband Joanna Simpson, who was present for the assessment.  Pt is unemployed and lives in [place] with her husband.  Pt appeared tremulous and confused.  Pt and Pt's husband provided history.  Per report, Pt has a history of depression and anxiety, and she is treated by Dr. Evelene Simpson.  Pt reported that she has felt more depressed over the last six months.  Pt's husband stated that for the last 1-2 weeks, Pt has experienced ''black-outs'' -- periods of time she cannot remember and during which she engages in self-injurious behavior.  Per husband, over the last week, Pt entered black-out periods and gouged her eyes, scratched at her chest, and put her head under the water in the bathtub.  Pt has no memory of these events.  Pt endorsed increased despondency, insomnia (about three hours per night), poor appetite, feelings of worthlessness/hopelessness; fatigue; isolation.  Pt also endorsed recent hallucination -- a little man who urges her to harm herself.  Pt also endorsed recent use of alcohol -- up to 1/5 of liquor per day.  During assessment, Pt presented as alert and oriented.  She had fair eye contact and was cooperative.  Demeanor was tremulous.  Pt was dressed in street clothes, and she appeared appropriately groomed.  Pt's mood was depressed and anxious, and affect was anxious.  Pt's speech was normal in rate, rhythm, and volume.  Thought processes were within normal range, and thought content was logical and goal-oriented.  Pt endorsed auditory and visual hallucination.  Pt's memory and concentration were fair.  Insight, judgment, and impulse control were fair to  poor.  Consulted with S. Rankin, NP who also spoke with Pt.  Pt meets inpatient criteria.  Diagnosis: Major Depressive Disorder, Recurrent, Severe w/psychotic features  Past Medical History:  Past Medical History:  Diagnosis Date  . Anxiety   . Hypertension   . Restless leg syndrome   . Tachycardia     Past Surgical History:  Procedure Laterality Date  . CHOLECYSTECTOMY    . ENDOMETRIAL ABLATION    . TUBAL LIGATION      Family History:  Family History  Problem Relation Age of Onset  . Diabetes Mother   . Hypertension Mother   . Cancer Mother        pancreatic  . Diabetes Father   . Hypertension Father   . Cancer Father        colon  . Diabetes Sister     Social History:  reports that she has been smoking cigarettes. She has been smoking about 0.50 packs per day. She has never used smokeless tobacco. She reports current alcohol use. She reports that she does not use drugs.  Additional Social History:  Alcohol / Drug Use Pain Medications: See MAR Prescriptions: See MAR Over the Counter: See MAR History of alcohol / drug use?: Yes Substance #1 Name of Substance 1: Alcohol 1 - Amount (size/oz): up to 1/5 of liquor 1 - Frequency: Episodic 1 - Duration: several months 1 - Last Use / Amount: 01/19/2019  CIWA:   COWS:    Allergies: No Known Allergies  Home Medications: (Not in a hospital admission)   OB/GYN Status:  No LMP recorded. Patient has had an ablation.  General Assessment Data Location of Assessment: [facility] Assessment Services TTS Assessment: In system Is this a Tele or Face-to-Face Assessment?: Face-to-Face Is this an Initial Assessment or a Re-assessment for this encounter?: Initial Assessment Patient Accompanied by:: Adult(Husband Joanna Simpson) Permission Given to speak with another: Yes(Husband present during assessment) Name, Relationship and Phone Number: Joanna Simpson, Husband, [phone] Language Other than English: No Living Arrangements: Other  (Comment) What gender do you identify as?: Female Marital status: Married Pregnancy Status: No Living Arrangements: Spouse/significant other Can pt return to current living arrangement?: Yes Admission Status: Voluntary Is patient capable of signing voluntary admission?: Yes Referral Source: Self/Family/Friend Insurance type: Product/process development scientist Exam [facility] Walk-in ONLY) Medical Exam completed: Yes  Crisis Care Plan Living Arrangements: Spouse/significant other Legal Guardian: (NA) Name of Psychiatrist: Dr. Evelene Simpson  Education Status Is patient currently in school?: No Is the patient employed, unemployed or receiving disability?: Unemployed  Risk to self with the past 6 months Suicidal Ideation: No-Not Currently/Within Last 6 Months Has patient been a risk to self within the past 6 months prior to admission? : Yes Suicidal Intent: No-Not Currently/Within Last 6 Months Has patient had any suicidal intent within the past 6 months prior to admission? : Yes Is patient at risk for suicide?: (See notes) Suicidal Plan?: No-Not Currently/Within Last 6 Months Has patient had any suicidal plan within the past 6 months prior to admission? : Yes Access to Means: Yes Specify Access to Suicidal Means: Per husband, Pt attempted to drown self in bathtub What has been your use of drugs/alcohol within the last 12 months?: Liquor Previous Attempts/Gestures: Yes How many times?: 1 Triggers for Past Attempts: Family contact(Conflict with husband) Intentional Self Injurious Behavior: Damaging, Bruising Comment - Self Injurious Behavior: Scratching/gouging during ''black out'' period Family Suicide History: No Recent stressful life event(s): Other (Comment)(Increased depression) Persecutory voices/beliefs?: No Depression: Yes Depression Symptoms: Despondent, Insomnia, Isolating, Fatigue, Loss of interest in usual pleasures, Feeling worthless/self pity Substance abuse history and/or treatment for  substance abuse?: No Suicide prevention information given to non-admitted patients: Not applicable  Risk to Others within the past 6 months Homicidal Ideation: No Does patient have any lifetime risk of violence toward others beyond the six months prior to admission? : No Thoughts of Harm to Others: No Current Homicidal Intent: No Current Homicidal Plan: No Access to Homicidal Means: No History of harm to others?: No Assessment of Violence: None Noted Does patient have access to weapons?: No Criminal Charges Pending?: No Does patient have a court date: No Is patient on probation?: No  Psychosis Hallucinations: Visual(''a little man'')  Mental Status Report Appearance/Hygiene: Unremarkable, Other (Comment)(Street clothes) Eye Contact: Good Motor Activity: Freedom of movement, Unremarkable Speech: Other (Comment)(circumstantial) Level of Consciousness: Alert Mood: Depressed, Anxious Affect: Appropriate to circumstance Anxiety Level: Severe Thought Processes: Coherent, Relevant Judgement: Impaired Orientation: Person, Place, Time, Situation Obsessive Compulsive Thoughts/Behaviors: None  Cognitive Functioning Concentration: Normal Memory: Recent Intact, Remote Intact Is patient IDD: No Insight: Fair Impulse Control: Fair Appetite: Poor Have you had any weight changes? : No Change Sleep: Decreased Total Hours of Sleep: 3 Vegetative Symptoms: None  ADLScreening [facility] Assessment Services) Patient's cognitive ability adequate to safely complete daily activities?: Yes Patient able to express need for assistance with ADLs?: Yes Independently performs ADLs?: Yes (appropriate for developmental age)  Prior Inpatient Therapy Prior Inpatient Therapy: Yes Prior Therapy Dates: 2018 Prior Therapy Facilty/Provider(s): [facility] Reason for Treatment: Suicide attempt -- overdose  Prior Outpatient Therapy Prior Outpatient Therapy: Yes Prior Therapy Dates: Ongoing Prior Therapy  Facilty/Provider(s): Dr. Evelene CroonKaur Reason for Treatment: Anxiety, Depression Does patient have an ACCT team?: No Does patient have Intensive In-House Services?  : No Does patient have [facility] services? : No Does patient have P4CC services?: No  ADL Screening (condition at time of admission) Patient's cognitive ability adequate to safely complete daily activities?: Yes Is the patient deaf or have difficulty hearing?: No Does the patient have difficulty seeing, even when wearing glasses/contacts?: No Does the patient have difficulty concentrating, remembering, or making decisions?: No Patient able to express need for assistance with ADLs?: Yes Does the patient have difficulty dressing or bathing?: No Independently performs ADLs?: Yes (appropriate for developmental age) Does the patient have difficulty walking or climbing stairs?: No Weakness of Legs: None Weakness of Arms/Hands: None  Home Assistive Devices/Equipment Home Assistive Devices/Equipment: None  Therapy Consults (therapy consults require a physician order) PT Evaluation Needed: No OT Evalulation Needed: No SLP Evaluation Needed: No Abuse/Neglect Assessment (Assessment to be complete while patient is alone) Abuse/Neglect Assessment Can Be Completed: Yes Physical Abuse: Denies Verbal Abuse: Denies Sexual Abuse: Denies Exploitation of patient/patient's resources: Denies Self-Neglect: Denies Values / Beliefs Cultural Requests During Hospitalization: None Spiritual Requests During Hospitalization: None Consults Spiritual Care Consult Needed: No Social Work Consult Needed: No            Disposition:  Disposition Initial Assessment Completed for this Encounter: Yes Disposition of Patient: Admit  On Site Evaluation by:   Reviewed with Physician:    Dorris FetchEugene T Kripa Foskey 01/23/2019 10:47 AM

## 2019-01-24 DIAGNOSIS — F13288 Sedative, hypnotic or anxiolytic dependence with other sedative, hypnotic or anxiolytic-induced disorder: Secondary | ICD-10-CM

## 2019-01-24 LAB — HEMOGLOBIN A1C
Hgb A1c MFr Bld: 5.5 % (ref 4.8–5.6)
Mean Plasma Glucose: 111.15 mg/dL

## 2019-01-24 MED ORDER — QUETIAPINE FUMARATE 200 MG PO TABS
200.0000 mg | ORAL_TABLET | Freq: Every day | ORAL | Status: DC
  Administered 2019-01-24 – 2019-01-25 (×2): 200 mg via ORAL
  Filled 2019-01-24 (×4): qty 1

## 2019-01-24 MED ORDER — ACETAMINOPHEN 325 MG PO TABS
650.0000 mg | ORAL_TABLET | Freq: Four times a day (QID) | ORAL | Status: DC | PRN
  Administered 2019-01-24 – 2019-01-31 (×8): 650 mg via ORAL
  Filled 2019-01-24 (×8): qty 2

## 2019-01-24 NOTE — BHH Group Notes (Signed)
[facility] [employer] Group Therapy      01/24/2019 3:36 PM  Type of Therapy: Mental Health Association Presentation  Participation Level: Did Not Attend   Summary of Progress/Problems: Mental Health Association (MHA) Speaker came to talk about his personal journey with mental health. The pt processed ways by which to relate to the speaker. MHA speaker provided handouts and educational information pertaining to groups and services offered by the [facility]. Pt was engaged in speaker's presentation and was receptive to resources provided.   Invited, chose not to attend.      Alcario Drought Clinical Social Worker

## 2019-01-24 NOTE — Progress Notes (Signed)
D: Patient is focused on her medications. She presents tremulous with a "severe headache."  She was given tylenol.  Also patient will ask for her xanax too close together.  She denies any thoughts of self harm.  She is observed in the day room interacting with her peers.  Patient can be irritable and labile at times.  A: Continue to monitor medication management and MD orders.  Safety checks completed every 15 minutes per protocol.  Offer support and encouragement as needed.  R: Patient is receptive to staff; his/her behavior is appropriate.

## 2019-01-24 NOTE — Plan of Care (Signed)
  Problem: Coping: Goal: Ability to verbalize frustrations and anger appropriately will improve Outcome: Progressing Goal: Ability to demonstrate self-control will improve Outcome: Progressing   Problem: Spiritual Needs Goal: Ability to function at adequate level Outcome: Not Progressing   Problem: Education: Goal: Emotional status will improve Outcome: Not Progressing Goal: Mental status will improve Outcome: Not Progressing   Problem: Activity: Goal: Interest or engagement in activities will improve Outcome: Not Progressing
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
## 2019-01-24 NOTE — Progress Notes (Signed)
Adult Psychoeducational Group Note  Date:  01/24/2019 Time:  9:46 PM  Group Topic/Focus:  Wrap-Up Group:   The focus of this group is to help patients review their daily goal of treatment and discuss progress on daily workbooks.  Participation Level:  Active  Participation Quality:  Appropriate  Affect:  Appropriate  Cognitive:  Alert and Oriented  Insight: Appropriate  Engagement in Group:  Developing/Improving  Modes of Intervention:  Clarification, Exploration and Support  Additional Comments:  Pt stated that her day was a 3. Pt verbalized that her goal was to come to meetings. Pt verbalized that she was able to come to "this meeting." Pt verbalized that something positive that happened was that her husband came to see her.   Jenafer Winterton, Randal Buba 01/24/2019, 9:46 PM

## 2019-01-24 NOTE — Progress Notes (Signed)
Adult Psychoeducational Group Note  Date:  01/24/2019 Time:  7:59 AM  Group Topic/Focus:  Wrap-Up Group:   The focus of this group is to help patients review their daily goal of treatment and discuss progress on daily workbooks.  Participation Level:  Active  Participation Quality:  Appropriate and Attentive  Affect:  Appropriate  Cognitive:  Alert and Appropriate  Insight: Appropriate and Good  Engagement in Group:  Improving  Modes of Intervention:  Discussion  Additional Comments:  Pt attend wrap up group. Pt was rude. She pulled her jacket on her shoulder she turn her back and then eventually moved to the side of the chair Pt said she do not want to be her.day was a 1. There was nothing positive that happen to her today.  [name] Long 01/24/2019, 7:59 AM
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
## 2019-01-24 NOTE — Progress Notes (Signed)
D: Pt was in hallway upon initial approach.  Pt presents with anxious, depressed affect and mood.  She describes her day as "all right."  Her goal is "to sleep because I woke up early twice last night."  She reports she had a good visit with her husband tonight.  Pt denies SI/HI, denies hallucinations, denies pain.  Pt has been visible in milieu interacting with peers and staff appropriately.  Pt attended evening group.    A: Introduced self to pt.  Met with pt 1:1.  Actively listened to pt and offered support and encouragement.  Medications administered per order.  PRN medication administered for anxiety.  Fall prevention techniques reviewed with pt and she verbalized understanding.  Q15 minute safety checks maintained.  R: Pt is safe on the unit.  Pt is compliant with medications.  Pt verbally contracts for safety.  Will continue to monitor and assess.

## 2019-01-25 MED ORDER — METOPROLOL TARTRATE 12.5 MG HALF TABLET
12.5000 mg | ORAL_TABLET | Freq: Two times a day (BID) | ORAL | Status: DC
  Administered 2019-01-25 – 2019-02-01 (×14): 12.5 mg via ORAL
  Filled 2019-01-25 (×16): qty 1

## 2019-01-25 NOTE — Tx Team (Signed)
Interdisciplinary Treatment and Diagnostic Plan Update  01/25/2019 Time of Session:  Joanna Simpson MRN: [id number]  Principal Diagnosis: <principal problem not specified>  Secondary Diagnoses: Active Problems:   MDD (major depressive disorder), recurrent, severe, with psychosis (HCC)   Current Medications:  Current Facility-Administered Medications  Medication Dose Route Frequency Provider Last Rate Last Dose  . acetaminophen (TYLENOL) tablet 650 mg  650 mg Oral Q6H PRN Antonieta Pert, MD   650 mg at 01/25/19 0752  . ALPRAZolam Prudy Feeler) tablet 1 mg  1 mg Oral QID PRN Antonieta Pert, MD   1 mg at 01/25/19 0749  . folic acid (FOLVITE) tablet 1 mg  1 mg Oral Daily Antonieta Pert, MD   1 mg at 01/25/19 0750  . hydrOXYzine (ATARAX/VISTARIL) tablet 25 mg  25 mg Oral Q4H PRN Antonieta Pert, MD   25 mg at 01/23/19 1556  . nicotine (NICODERM CQ - dosed in mg/24 hours) patch 21 mg  21 mg Transdermal Daily Antonieta Pert, MD   21 mg at 01/25/19 0750  . pneumococcal 23 valent vaccine (PNU-IMMUNE) injection 0.5 mL  0.5 mL Intramuscular Tomorrow-1000 Antonieta Pert, MD      . QUEtiapine (SEROQUEL) tablet 200 mg  200 mg Oral QHS Antonieta Pert, MD   200 mg at 01/24/19 2058  . rOPINIRole (REQUIP) tablet 3 mg  3 mg Oral QHS Antonieta Pert, MD   3 mg at 01/24/19 2058  . thiamine (VITAMIN B-1) tablet 100 mg  100 mg Oral Daily Antonieta Pert, MD   100 mg at 01/25/19 0750  . traZODone (DESYREL) tablet 50 mg  50 mg Oral QHS PRN Antonieta Pert, MD       PTA Medications: Medications Prior to Admission  Medication Sig Dispense Refill Last Dose  . ALPRAZolam (XANAX) 1 MG tablet Take 1 mg by mouth 4 (four) times daily as needed for anxiety.    Taking  . phentermine 37.5 MG capsule Take 37.5 mg by mouth every morning.     Marland Kitchen rOPINIRole (REQUIP) 3 MG tablet Take 3 mg by mouth at bedtime.   0 Taking  . nicotine (NICODERM CQ - DOSED IN MG/24 HOURS) 21 mg/24hr patch Place 1  patch (21 mg total) onto the skin daily. (Patient not taking: Reported on 01/24/2019) 28 patch 0 Not Taking at Unknown time    Patient Stressors: Health problems Marital or family conflict Other: psychosis, blackouts  Patient Strengths: Wellsite geologist fund of knowledge Supportive family/friends  Treatment Modalities: Medication Management, Group therapy, Case management,  1 to 1 session with clinician, Psychoeducation, Recreational therapy.   Physician Treatment Plan for Primary Diagnosis: <principal problem not specified> Long Term Goal(s): Improvement in symptoms so as ready for discharge Improvement in symptoms so as ready for discharge   Short Term Goals: Ability to identify changes in lifestyle to reduce recurrence of condition will improve Ability to verbalize feelings will improve Ability to disclose and discuss suicidal ideas Ability to demonstrate self-control will improve Ability to identify and develop effective coping behaviors will improve Ability to maintain clinical measurements within normal limits will improve Compliance with prescribed medications will improve Ability to identify triggers associated with substance abuse/mental health issues will improve Ability to identify changes in lifestyle to reduce recurrence of condition will improve Ability to verbalize feelings will improve Ability to disclose and discuss suicidal ideas Ability to demonstrate self-control will improve Ability to identify and develop effective coping behaviors will improve  Ability to maintain clinical measurements within normal limits will improve Compliance with prescribed medications will improve Ability to identify triggers associated with substance abuse/mental health issues will improve  Medication Management: Evaluate patient's response, side effects, and tolerance of medication regimen.  Therapeutic Interventions: 1 to 1 sessions, Unit Group sessions and Medication  administration.  Evaluation of Outcomes: Progressing  Physician Treatment Plan for Secondary Diagnosis: Active Problems:   MDD (major depressive disorder), recurrent, severe, with psychosis (HCC)  Long Term Goal(s): Improvement in symptoms so as ready for discharge Improvement in symptoms so as ready for discharge   Short Term Goals: Ability to identify changes in lifestyle to reduce recurrence of condition will improve Ability to verbalize feelings will improve Ability to disclose and discuss suicidal ideas Ability to demonstrate self-control will improve Ability to identify and develop effective coping behaviors will improve Ability to maintain clinical measurements within normal limits will improve Compliance with prescribed medications will improve Ability to identify triggers associated with substance abuse/mental health issues will improve Ability to identify changes in lifestyle to reduce recurrence of condition will improve Ability to verbalize feelings will improve Ability to disclose and discuss suicidal ideas Ability to demonstrate self-control will improve Ability to identify and develop effective coping behaviors will improve Ability to maintain clinical measurements within normal limits will improve Compliance with prescribed medications will improve Ability to identify triggers associated with substance abuse/mental health issues will improve     Medication Management: Evaluate patient's response, side effects, and tolerance of medication regimen.  Therapeutic Interventions: 1 to 1 sessions, Unit Group sessions and Medication administration.  Evaluation of Outcomes: Progressing   RN Treatment Plan for Primary Diagnosis: <principal problem not specified> Long Term Goal(s): Knowledge of disease and therapeutic regimen to maintain health will improve  Short Term Goals: Ability to participate in decision making will improve, Ability to verbalize feelings will improve,  Ability to disclose and discuss suicidal ideas, Ability to identify and develop effective coping behaviors will improve and Compliance with prescribed medications will improve  Medication Management: RN will administer medications as ordered by provider, will assess and evaluate patient's response and provide education to patient for prescribed medication. RN will report any adverse and/or side effects to prescribing provider.  Therapeutic Interventions: 1 on 1 counseling sessions, Psychoeducation, Medication administration, Evaluate responses to treatment, Monitor vital signs and CBGs as ordered, Perform/monitor CIWA, COWS, AIMS and Fall Risk screenings as ordered, Perform wound care treatments as ordered.  Evaluation of Outcomes: Progressing   LCSW Treatment Plan for Primary Diagnosis: <principal problem not specified> Long Term Goal(s): Safe transition to appropriate next level of care at discharge, Engage patient in therapeutic group addressing interpersonal concerns.  Short Term Goals: Engage patient in aftercare planning with referrals and resources  Therapeutic Interventions: Assess for all discharge needs, 1 to 1 time with Social worker, Explore available resources and support systems, Assess for adequacy in community support network, Educate family and significant other(s) on suicide prevention, Complete Psychosocial Assessment, Interpersonal group therapy.  Evaluation of Outcomes: Progressing   Progress in Treatment: Attending groups: No. Participating in groups: No. Taking medication as prescribed: Yes. Toleration medication: Yes. Family/Significant other contact made: No, will contact:  the patient's husband Patient understands diagnosis: Yes. Discussing patient identified problems/goals with staff: Yes. Medical problems stabilized or resolved: Yes. Denies suicidal/homicidal ideation: Yes. Issues/concerns per patient self-inventory: No. Other:   New problem(s)  identified:None   New Short Term/Long Term Goal(s): medication stabilization, elimination of SI thoughts, development of comprehensive  mental wellness plan.   Patient Goals:  I want to feel better and I want to have a life again  Discharge Plan or Barriers: CSW continues to assess the patient's discharge destination. The patient has [place] made a decision on [place]. CSW will continue to follow and assess for appropriate referrals and discharge planning.   Reason for Continuation of Hospitalization: Depression Hallucinations Medication stabilization Suicidal ideation Withdrawal symptoms  Estimated Length of Stay: 01/29/2019  Attendees: Patient: 01/25/2019 10:48 AM  Physician: Dr. Landry MellowGreg Clary, MD 01/25/2019 10:48 AM  Nursing: Huntley DecSara.Elbert EwingsL, RN 01/25/2019 10:48 AM  RN Care Manager: Onnie BoerJennifer Clark, RN 01/25/2019 10:48 AM  Social Worker: Baldo DaubJolan Tongela Encinas, LCSWA 01/25/2019 10:48 AM  Recreational Therapist:  01/25/2019 10:48 AM  Other: Marciano SequinJanet Sykes, NP 01/25/2019 10:48 AM  Other:  01/25/2019 10:48 AM  Other: 01/25/2019 10:48 AM    Scribe for Treatment Team: Maeola SarahJolan E Earsie Humm, LCSWA 01/25/2019 10:48 AM

## 2019-01-25 NOTE — Progress Notes (Signed)
Recreation Therapy Notes  Date:  1.31.20 Time: 0930 Location: [address] Dayroom  Group Topic: Stress Management  Goal Area(s) Addresses:  Patient will identify positive stress management techniques. Patient will identify benefits of using stress management post d/c.  Intervention: Stress Management  Activity :  Progressive Muscle Relaxation.  LRT introduced the stress management technique of progressive muscle relaxation.  LRT read a script that guided patients through the process of tensing and relaxing each muscle group individually.  Patients were to follow along as script was read to engage in activity.  Education:  Stress Management, Discharge Planning.   Education Outcome: Acknowledges Education  Clinical Observations/Feedback:  Pt did not attend group.     Tyrrell Stephens, LRT/CTRS        Olanna Percifield A 01/25/2019 11:18 AM 

## 2019-01-25 NOTE — Progress Notes (Signed)
Patient ID: Joanna Simpson, female   DOB: November 04, 1966, 53 y.o.   MRN: [id number] D: Patient visiting with husband on approach. Pt reports her day was "getting better". Pt mood and affect appears depressed and anxious. Pt reports about her blackout episode and wants husband to be more involved in her care and also looking for long term placement. Pt attended evening wrap up group and engaged in interaction. Denies  SI/HI/AVH and pain.No behavioral issues noted.  A: Support and encouragement offered as needed to express needs. Medications administered as prescribed.  R: Patient is safe and cooperative on unit. Will continue to monitor  for safety and stability.

## 2019-01-25 NOTE — Progress Notes (Signed)
Adult Psychoeducational Group Note  Date:  01/25/2019 Time:  7:15 PM  Group Topic/Focus:  Conflict Resolution:   The focus of this group is to discuss the conflict resolution process and how it may be used upon discharge.  Participation Level:  Active  Participation Quality:  Appropriate  Affect:  Appropriate  Cognitive:  Alert  Insight: Good  Engagement in Group:  Engaged  Modes of Intervention:  Discussion  Additional Comments:  Pt attended group and participated in group discussion.  [name] R Pratik Dalziel 01/25/2019, 7:15 PM

## 2019-01-25 NOTE — Progress Notes (Signed)
[facility]  01/25/2019 12:00 PM Joanna PerchesBeverly A Simpson  MRN:  [id number] Subjective:  Patient is seen and examined.  Patient is a 53 year old female with a past psychiatric history significant for depression, anxiety, posttraumatic stress disorder, psychosis, probable Xanax dependence.  She presented as a walk-in patient to the behavioral health hospital on 01/23/2019.  Objective: Patient is seen and examined.  Patient is a 53 year old female with the above-stated past psychiatric history seen in follow-up.  She is perhaps slightly better today.  She did sleep well last night.  She denied any auditory or visual hallucinations.  She remains very focused on her Xanax.  We have decided to continue her Xanax at 1 mg p.o. 4 times daily.  She is stating that she did not overuse it earlier, and that she usually only takes it 3 times a day.  She stated she has a half a bottle of it at home.  She is highly offended when we discussed Xanax dependency.  Her blood pressures been elevated, and we discussed the possibility that some of this may be due to alcohol withdrawal versus Xanax withdrawal, and she gets irritated with that.  She stated today she is curious about when she will be discharged.  When we discussed the possibility of discharge on Monday she revealed that she has a court date [place] [facility] [place].  She stated she had to get to that court date, but then further discussion of her medicines letter to say that she was willing to stay until Monday to make sure that everything was in place.  Part of that was my agreement not to wean her off the Xanax.  She denied any suicidal ideation.  As per above, she also denied any auditory or visual hallucinations.  Principal Problem: <principal problem not specified> Diagnosis: Active Problems:   MDD (major depressive disorder), recurrent, severe, with psychosis (HCC)  Total Time spent with patient: 15 minutes  Past  Psychiatric History: See admission H&P  Past Medical History:  Past Medical History:  Diagnosis Date  . Anxiety   . Hypertension   . Restless leg syndrome   . Tachycardia     Past Surgical History:  Procedure Laterality Date  . CHOLECYSTECTOMY    . ENDOMETRIAL ABLATION    . TUBAL LIGATION     Family History:  Family History  Problem Relation Age of Onset  . Diabetes Mother   . Hypertension Mother   . Cancer Mother        pancreatic  . Diabetes Father   . Hypertension Father   . Cancer Father        colon  . Diabetes Sister    Family Psychiatric  History: See admission H&P Social History:  Social History   Substance and Sexual Activity  Alcohol Use Yes   Comment: Pt reported recently drinking up to 1/5 of liquor per day     Social History   Substance and Sexual Activity  Drug Use No    Social History   Socioeconomic History  . Marital status: Married    Spouse name: Mellody DanceKeith  . Number of children: Not on file  . Years of education: Not on file  . Highest education level: Not on file  Occupational History  . Occupation: Unemployed  Social Needs  . Financial resource strain: Not on file  . Food insecurity:    Worry: Not on file    Inability: Not on file  .  Transportation needs:    Medical: Not on file    Non-medical: Not on file  Tobacco Use  . Smoking status: Current Every Day Smoker    Packs/day: 0.50    Types: Cigarettes  . Smokeless tobacco: Never Used  Substance and Sexual Activity  . Alcohol use: Yes    Comment: Pt reported recently drinking up to 1/5 of liquor per day  . Drug use: No  . Sexual activity: Yes    Birth control/protection: Surgical  Lifestyle  . Physical activity:    Days per week: Not on file    Minutes per session: Not on file  . Stress: Not on file  Relationships  . Social connections:    Talks on phone: Not on file    Gets together: Not on file    Attends religious service: Not on file    Active member of club or  organization: Not on file    Attends meetings of clubs or organizations: Not on file    Relationship status: Not on file  Other Topics Concern  . Not on file  Social History Narrative   Pt lives with husband [name] in [place]; unemployed.  Followed by [name].   Additional Social History:    Pain Medications: See MAR Prescriptions: See MAR Over the Counter: See MAR History of alcohol / drug use?: Yes Name of Substance 1: Alcohol 1 - Amount (size/oz): up to 1/5 of liquor 1 - Frequency: Episodic 1 - Duration: several months 1 - Last Use / Amount: 01/19/2019                  Sleep: Fair  Appetite:  Fair  Current Medications: Current Facility-Administered Medications  Medication Dose Route Frequency Provider Last Rate Last Dose  . acetaminophen (TYLENOL) tablet 650 mg  650 mg Oral Q6H PRN Antonieta Pert, MD   650 mg at 01/25/19 0752  . ALPRAZolam Prudy Feeler) tablet 1 mg  1 mg Oral QID PRN Antonieta Pert, MD   1 mg at 01/25/19 0749  . folic acid (FOLVITE) tablet 1 mg  1 mg Oral Daily Antonieta Pert, MD   1 mg at 01/25/19 0750  . hydrOXYzine (ATARAX/VISTARIL) tablet 25 mg  25 mg Oral Q4H PRN Antonieta Pert, MD   25 mg at 01/23/19 1556  . nicotine (NICODERM CQ - dosed in mg/24 hours) patch 21 mg  21 mg Transdermal Daily Antonieta Pert, MD   21 mg at 01/25/19 0750  . pneumococcal 23 valent vaccine (PNU-IMMUNE) injection 0.5 mL  0.5 mL Intramuscular Tomorrow-1000 Antonieta Pert, MD      . QUEtiapine (SEROQUEL) tablet 200 mg  200 mg Oral QHS Antonieta Pert, MD   200 mg at 01/24/19 2058  . rOPINIRole (REQUIP) tablet 3 mg  3 mg Oral QHS Antonieta Pert, MD   3 mg at 01/24/19 2058  . thiamine (VITAMIN B-1) tablet 100 mg  100 mg Oral Daily Antonieta Pert, MD   100 mg at 01/25/19 0750  . traZODone (DESYREL) tablet 50 mg  50 mg Oral QHS PRN Antonieta Pert, MD        Lab Results:  Results for orders placed or performed during the hospital  encounter of 01/23/19 (from the past 48 hour(s))  Urinalysis, Complete w Microscopic     Status: Abnormal   Collection Time: 01/23/19  2:10 PM  Result Value Ref Range   Color, Urine YELLOW YELLOW   APPearance TURBID (  A) CLEAR   Specific Gravity, Urine 1.027 1.005 - 1.030   pH 5.0 5.0 - 8.0   Glucose, UA NEGATIVE NEGATIVE mg/dL   Hgb urine dipstick NEGATIVE NEGATIVE   Bilirubin Urine NEGATIVE NEGATIVE   Ketones, ur NEGATIVE NEGATIVE mg/dL   Protein, ur NEGATIVE NEGATIVE mg/dL   Nitrite NEGATIVE NEGATIVE   Leukocytes, UA NEGATIVE NEGATIVE   RBC / HPF 0-5 0 - 5 RBC/hpf   Bacteria, UA RARE (A) NONE SEEN   Squamous Epithelial / LPF 6-10 0 - 5   Amorphous Crystal PRESENT     Comment: Performed at [facility], [address] [place], [place] [address]  Urine rapid drug screen (hosp performed)not at [facility]     Status: Abnormal   Collection Time: 01/23/19  2:10 PM  Result Value Ref Range   Opiates NONE DETECTED NONE DETECTED   Cocaine NONE DETECTED NONE DETECTED   Benzodiazepines POSITIVE (A) NONE DETECTED   Amphetamines NONE DETECTED NONE DETECTED   Tetrahydrocannabinol NONE DETECTED NONE DETECTED   Barbiturates NONE DETECTED NONE DETECTED    Comment: (NOTE) DRUG SCREEN FOR MEDICAL PURPOSES ONLY.  IF CONFIRMATION IS NEEDED FOR ANY PURPOSE, NOTIFY LAB WITHIN 5 DAYS. LOWEST DETECTABLE LIMITS FOR URINE DRUG SCREEN Drug Class                     Cutoff (ng/mL) Amphetamine and metabolites    1000 Barbiturate and metabolites    200 Benzodiazepine                 200 Tricyclics and metabolites     300 Opiates and metabolites        300 Cocaine and metabolites        300 THC                            50 Performed at [facility] [place] [facility], [address] [place], [place] [address]   CBC     Status: None   Collection Time: 01/23/19  6:30 PM  Result Value Ref Range   WBC 9.5 4.0 - 10.5 K/uL   RBC 4.62 3.87 - 5.11 MIL/uL   Hemoglobin 14.6 12.0 - 15.0  g/dL   HCT 29.544.3 62.136.0 - 30.846.0 %   MCV 95.9 80.0 - 100.0 fL   MCH 31.6 26.0 - 34.0 pg   MCHC 33.0 30.0 - 36.0 g/dL   RDW 65.713.7 84.611.5 - 96.215.5 %   Platelets 212 150 - 400 K/uL   nRBC 0.0 0.0 - 0.2 %    Comment: Performed at [facility], [address] [place], [place] [address]  Comprehensive metabolic panel     Status: Abnormal   Collection Time: 01/23/19  6:30 PM  Result Value Ref Range   Sodium 137 135 - 145 mmol/L   Potassium 3.5 3.5 - 5.1 mmol/L   Chloride 105 98 - 111 mmol/L   CO2 23 22 - 32 mmol/L   Glucose, Bld 107 (H) 70 - 99 mg/dL   BUN 26 (H) 6 - 20 mg/dL   Creatinine, Ser 1.320.78 0.44 - 1.00 mg/dL   Calcium 9.0 8.9 - 44.010.3 mg/dL   Total Protein 6.8 6.5 - 8.1 g/dL   Albumin 4.1 3.5 - 5.0 g/dL   AST 13 (L) 15 - 41 U/L   ALT 14 0 - 44 U/L   Alkaline Phosphatase 44 38 - 126 U/L   Total  Bilirubin 0.4 0.3 - 1.2 mg/dL   GFR calc non Af Amer >60 >60 mL/min   GFR calc Af Amer >60 >60 mL/min   Anion gap 9 5 - 15    Comment: Performed at [place] [facility], [address], [place], [place] [address]  Ethanol     Status: None   Collection Time: 01/23/19  6:30 PM  Result Value Ref Range   Alcohol, Ethyl (B) <10 <10 mg/dL    Comment: (NOTE) Lowest detectable limit for serum alcohol is 10 mg/dL. For medical purposes only. Performed at [facility], [address] [place], [place] [address]   Hemoglobin A1c     Status: None   Collection Time: 01/23/19  6:30 PM  Result Value Ref Range   Hgb A1c MFr Bld 5.5 4.8 - 5.6 %    Comment: (NOTE) Pre diabetes:          5.7%-6.4% Diabetes:              >6.4% Glycemic control for   <7.0% adults with diabetes    Mean Plasma Glucose 111.15 mg/dL    Comment: Performed at [facility], [address] [place], [place] [address]  Lipid panel     Status: Abnormal   Collection Time: 01/23/19  6:30 PM  Result Value Ref Range   Cholesterol 196 0 - 200 mg/dL   Triglycerides 425 (H) <150  mg/dL   HDL 36 (L) >95 mg/dL   Total CHOL/HDL Ratio 5.4 RATIO   VLDL 37 0 - 40 mg/dL   LDL Cholesterol 638 (H) 0 - 99 mg/dL    Comment:        Total Cholesterol/HDL:CHD Risk Coronary Heart Disease Risk Table                     Men   Women  1/2 Average Risk   3.4   3.3  Average Risk       5.0   4.4  2 X Average Risk   9.6   7.1  3 X Average Risk  23.4   11.0        Use the calculated Patient Ratio above and the CHD Risk Table to determine the patient's CHD Risk.        ATP III CLASSIFICATION (LDL):  <100     mg/dL   Optimal  756-433  mg/dL   Near or Above                    Optimal  130-159  mg/dL   Borderline  295-188  mg/dL   High  >416     mg/dL   Very High Performed at [facility], [address] [place], [place] [address]   TSH     Status: None   Collection Time: 01/23/19  6:30 PM  Result Value Ref Range   TSH 4.475 0.350 - 4.500 uIU/mL    Comment: Performed by a 3rd Generation assay with a functional sensitivity of <=0.01 uIU/mL. Performed at [facility], [address], [place], [place] [address]   Ammonia     Status: None   Collection Time: 01/23/19  6:30 PM  Result Value Ref Range   Ammonia 24 9 - 35 umol/L    Comment: Performed at [facility], [address] [place], [place] [address]    Blood Alcohol level:  Lab Results  Component Value Date  ETH <10 01/23/2019   ETH <5 07/17/2017    Metabolic Disorder Labs: Lab Results  Component Value Date   HGBA1C 5.5 01/23/2019   MPG 111.15 01/23/2019   MPG 100 07/19/2017   No results found for: PROLACTIN Lab Results  Component Value Date   CHOL 196 01/23/2019   TRIG 186 (H) 01/23/2019   HDL 36 (L) 01/23/2019   CHOLHDL 5.4 01/23/2019   VLDL 37 01/23/2019   LDLCALC 123 (H) 01/23/2019   LDLCALC 99 07/19/2017    Physical Findings: AIMS:  , ,  ,  ,    CIWA:  CIWA-Ar Total: 10 COWS:     Musculoskeletal: Strength & Muscle Tone: within  normal limits Gait & Station: normal Patient leans: N/A  Psychiatric Specialty Exam: Physical Exam  Nursing note and vitals reviewed. Constitutional: She is oriented to person, place, and time. She appears well-developed and well-nourished.  HENT:  Head: Normocephalic and atraumatic.  Respiratory: Effort normal.  Neurological: She is alert and oriented to person, place, and time.    ROS  Blood pressure (!) 156/106, pulse (!) 126, temperature 98.6 F (37 C), temperature source Oral, resp. rate 20, height 5\' 5"  (1.651 m), weight 69.9 kg, SpO2 100 %.Body mass index is 25.63 kg/m.  General Appearance: Casual  Eye Contact:  Fair  Speech:  Normal Rate  Volume:  Normal  Mood:  Anxious and Irritable  Affect:  Congruent  Thought Process:  Coherent and Descriptions of Associations: Circumstantial  Orientation:  Full (Time, Place, and Person)  Thought Content:  Logical  Suicidal Thoughts:  No  Homicidal Thoughts:  No  Memory:  Immediate;   Fair Recent;   Fair Remote;   Fair  Judgement:  Impaired  Insight:  Lacking  Psychomotor Activity:  Increased  Concentration:  Concentration: Fair and Attention Span: Fair  Recall:  [employer] of Knowledge:  Fair  Language:  Fair  Akathisia:  Negative  Handed:  Right  AIMS (if indicated):     Assets:  Desire for Improvement Housing Physical Health Resilience Social Support  ADL's:  Intact  Cognition:  WNL  Sleep:  Number of Hours: 6.75     Treatment Plan Summary: Daily contact with patient to assess and evaluate symptoms and progress in treatment, Medication management and Plan : Patient is seen and examined.  Patient is a 53 year old female with the above-stated past psychiatric history who is seen in follow-up.  She remains significantly anxious, and worried about her Xanax being removed.  We have discussed this extensively, and we will continue her Xanax.  We will leave it up to Dr.Kaur to decide whether or not she wants her to continue  on the Xanax.  Her blood pressure and heart rate have been elevated, and I will add low-dose metoprolol to see if that can help.  She denied any auditory or visual hallucinations.  We will continue the Seroquel given the success in helping her sleep.  We will continue to monitor for withdrawal symptoms. 1.  Continue Xanax 1 mg p.o. 4 times daily as needed anxiety. 2.  Continue folic acid 1 mg p.o. daily for nutritional supplementation. 3.  Continue hydroxyzine 25 mg p.o. every 4 hours as needed anxiety. 4.  Continue Seroquel 200 mg p.o. nightly for mood stability, psychosis and sleep. 5.  Continue Requip 3 mg p.o. nightly for restless leg syndrome. 6.  Continue thiamine 100 mg p.o. daily for nutritional supplementation. 7.  Continue trazodone 50 mg p.o. nightly as needed  insomnia. 8.  Start metoprolol 12.5 mg p.o. twice daily for hypertension and tachycardia. 9.  Disposition planning-in progress.  Antonieta Pert, MD 01/25/2019, 12:00 PM

## 2019-01-26 DIAGNOSIS — F333 Major depressive disorder, recurrent, severe with psychotic symptoms: Principal | ICD-10-CM

## 2019-01-26 MED ORDER — QUETIAPINE FUMARATE 50 MG PO TABS
50.0000 mg | ORAL_TABLET | Freq: Once | ORAL | Status: AC
  Administered 2019-01-26: 50 mg via ORAL
  Filled 2019-01-26 (×2): qty 1

## 2019-01-26 MED ORDER — QUETIAPINE FUMARATE 50 MG PO TABS
250.0000 mg | ORAL_TABLET | Freq: Every day | ORAL | Status: DC
  Administered 2019-01-26 – 2019-01-29 (×4): 250 mg via ORAL
  Filled 2019-01-26 (×5): qty 1

## 2019-01-26 MED ORDER — PROPRANOLOL HCL 10 MG PO TABS
10.0000 mg | ORAL_TABLET | Freq: Three times a day (TID) | ORAL | Status: DC
  Administered 2019-01-26 – 2019-01-29 (×9): 10 mg via ORAL
  Filled 2019-01-26 (×16): qty 1

## 2019-01-26 NOTE — Progress Notes (Signed)
Nursing Progress Note: 7p-7a D: Pt currently presents with a anxious affect and behavior. Interacting appropriately with the milieu. Pt reports good sleep during the previous night with current medication regimen. Pt did attend wrap-up group.  A: Pt provided with medications per providers orders. Pt's labs and vitals were monitored throughout the night. Pt supported emotionally and encouraged to express concerns and questions. Pt educated on medications.  R: Pt's safety ensured with 15 minute and environmental checks. Pt currently denies SI, HI, and AVH. Pt verbally contracts to seek staff if SI,HI, or AVH occurs and to consult with staff before acting on any harmful thoughts. Will continue to monitor.  

## 2019-01-26 NOTE — [facility] Group Notes (Signed)
LCSW Group Therapy Note  01/26/2019   10:00-11:00am   Type of Therapy and Topic:  Group Therapy: Anger Cues and Responses  Participation Level:  Active   Description of Group:   In this group, patients learned how to recognize the physical, cognitive, emotional, and behavioral responses they have to anger-provoking situations.  They identified a recent time they became angry and how they reacted.  They analyzed how their reaction was possibly beneficial and how it was possibly unhelpful.  The group discussed a variety of healthier coping skills that could help with such a situation in the future.  Deep breathing was practiced briefly.  Therapeutic Goals: 1. Patients will remember their last incident of anger and how they felt emotionally and physically, what their thoughts were at the time, and how they behaved. 2. Patients will identify how their behavior at that time worked for them, as well as how it worked against them. 3. Patients will explore possible new behaviors to use in future anger situations. 4. Patients will learn that anger itself is normal and cannot be eliminated, and that healthier reactions can assist with resolving conflict rather than worsening situations.  Summary of Patient Progress:  The patient shared that her most recent time of anger was yesterday and said her anger was caused by hospital staff not paying attention to her need for Tylenol.  She talked about her blood pressure being high and said "I almost stroked out."  She cursed at the nurse.  She is angry because she has only talked to the doctor and social worker one time each.  She wants another doctor and wants to go to a different treatment facility.  A patient whose husband died just yesterday was tearful and angry about this, and this patient interjected a great deal about what she went through when her father died.  After group she spoke 1:1 with CSW and asked that we call her husband because he knows things  about her that she cannot recall herself due to her blackouts.  She talked extensively about the man she can see, as well as about hearing voices.  Therapeutic Modalities:   Cognitive Behavioral Therapy  Lynnell Chad

## 2019-01-26 NOTE — [facility] Suicide Risk Assessment (Signed)
[facility] INPATIENT:  Family/Significant Other Suicide Prevention Education  Suicide Prevention Education:  Education Completed; with husband, Joanna Simpson [phone], has been identified by the patient as the family member/significant other with whom the patient will be residing, and identified as the person(s) who will aid the patient in the event of a mental health crisis (suicidal ideations/suicide attempt).  With written consent from the patient, the family member/significant other has been provided the following suicide prevention education, prior to the and/or following the discharge of the patient.  The suicide prevention education provided includes the following:  Suicide risk factors  Suicide prevention and interventions  National Suicide Hotline telephone number  [facility] assessment telephone number  [facility] Emergency Assistance 911  [facility] and/or Residential Mobile Crisis Unit telephone number  Request made of family/significant other to:  Remove weapons (e.g., guns, rifles, knives), all items previously/currently identified as safety concern.    Remove drugs/medications (over-the-counter, prescriptions, illicit drugs), all items previously/currently identified as a safety concern.  The family member/significant other verbalizes understanding of the suicide prevention education information provided.  The family member/significant other agrees to remove the items of safety concern listed above.  Husband, Joanna Simpson, shares that he and the patient separated around December 10th. Shortly afterward, the patient became distant from all family. Husband endorses that patient started drinking heavily around Christmas. Husband aware patient is Rx'd Xanax and only sees her psychiatrist once or twice a year.  Husband expects patient to return home with him in [place], he thinks patient would benefit from seeing a counselor or therapist and meeting with another  psychiatrist on a more regular basis. He is aware patient is reluctant to leave her current provider, sharing "as long as she has her Xanax, she's happy."  Husband spoke at length about the "little man," or "hat man," as he calls it. He shares he did some internet research and the hat man is a somewhat common dark spirit people see. "It's like something from the exorcist." Husband shares he is a spiritual person and he believes his wife sees the hat man.   Husband shares he has witnessed patient's "blackouts" after seeing the hat man. Patient will become violent and will scream and attempt to injure herself. Last blackout, husband called 911 and the patient "came to" right as the sheriff arrived and was herself again.  There are guns in the home, husband is working to collect and secure them.  Darreld Mclean 01/26/2019, 10:48 AM

## 2019-01-26 NOTE — [facility] Group Notes (Signed)
Adult Psychoeducational Group Note  Date:  01/26/2019 Time:  5:57 PM  Group Topic/Focus:  Healthy Communication:   The focus of this group is to discuss communication, barriers to communication, as well as healthy ways to communicate with others.  Participation Level:  Active  Participation Quality:  Appropriate  Affect:  Appropriate  Cognitive:  Appropriate  Insight: Appropriate  Engagement in Group:  Engaged  Modes of Intervention:  Activity, Discussion, Exploration, Rapport Building, Socialization and Support  Additional Comments:  Pt attended and participated during the group activity.  Bannie Lobban C 01/26/2019, 5:57 PM  

## 2019-01-26 NOTE — [facility] Group Notes (Signed)
Adult Psychoeducational Group Note  Date:  01/26/2019 Time:  9:23 PM  Group Topic/Focus:  Wrap-Up Group:   The focus of this group is to help patients review their daily goal of treatment and discuss progress on daily workbooks.  Participation Level:  Active  Participation Quality:  Appropriate and Attentive  Affect:  Appropriate  Cognitive:  Alert and Appropriate  Insight: Appropriate and Good  Engagement in Group:  Engaged  Modes of Intervention:  Discussion and Education  Additional Comments:  Pt attended and participated in wrap up group this evening. Pt rated their day a 2/10, due to the event that happen today on the hall. Pt had no goal for the day and no goal for tomorrow.   Chrisandra Netters 01/26/2019, 9:23 PM

## 2019-01-26 NOTE — Progress Notes (Signed)
Adult Psychoeducational Group Note  Date:  01/26/2019 Time:  8:54 PM  Group Topic/Focus:  Wrap-Up Group:   The focus of this group is to help patients review their daily goal of treatment and discuss progress on daily workbooks.  Participation Level:  Active  Participation Quality:  Appropriate  Affect:  Appropriate  Cognitive:  Alert and Appropriate  Insight: Appropriate  Engagement in Group:  Engaged  Modes of Intervention:  Discussion  Additional Comments:pt attend wrap up group. Her day was a 2. She is upset with the doctor is not listening to her and her needs to help her to get better. She feel as if she do not need to be here. She has asked for help but no one is listening to her. She black out she do not want to do that she want to get her life back to normal .   [name] Long 01/26/2019, 8:54 PM

## 2019-01-26 NOTE — Progress Notes (Addendum)
[facility]  01/26/2019 2:22 PM Joanna PerchesBeverly A Simpson  MRN:  [id number]  Subjective: Joanna SpragueBeverly reports, "I have been here since Thursday. I was seeing things, going crazy, then blacked completely out. I'm okay now, but still cannot sleep at night. But, I'm ready to go home now. Nothing good is going on with me today. I have bad headaches, my sleep is bad. I usually take my Xanax pill + 2 tylenol PM tablets before I can have a good night sleep. I'm not suicidal but feeling homicidal towards the doctor. I don't think that I'm getting the help that I need from this hospital. I'm Xanax because I shake all over all the time. I inherited it. My mother had it. My sister had it now, my daughter is experiencing it. But this doctor thinks I take Xanax because I'm an addict. No, I'm not an addict. I take Xanax for tremors because I shake".  Objective: Patient is seen and examined.  Patient is a 53 year old female with the above-stated past psychiatric history seen in follow-up.  She is perhaps slightly better today.  She did sleep well last night.  She denied any auditory or visual hallucinations.  She remains very focused on her Xanax.  We have decided to continue her Xanax at 1 mg p.o. 4 times daily.  She is stating that she did not overuse it earlier, and that she usually only takes it 3 times a day.  She stated she has a half a bottle of it at home.  She is highly offended when we discussed Xanax dependency.  Her blood pressures been elevated, and we discussed the possibility that some of this may be due to alcohol withdrawal versus Xanax withdrawal, and she gets irritated with that.  She stated today she is curious about when she will be discharged.  When we discussed the possibility of discharge on Monday she revealed that [place] [facility] [place]  She stated she had to get to that court date, but then further discussion of her medicines letter to say that  she was willing to stay until Monday to make sure that everything was in place.  Part of that was my agreement not to wean her off the Xanax.  She denied any suicidal ideation.  As per above, she also denied any auditory or visual hallucinations.  Principal Problem: <principal problem not specified> Diagnosis: Active Problems:   MDD (major depressive disorder), recurrent, severe, with psychosis (HCC)  Total Time spent with patient: 15 minutes  Past Psychiatric History: See admission H&P  Past Medical History:  Past Medical History:  Diagnosis Date  . Anxiety   . Hypertension   . Restless leg syndrome   . Tachycardia     Past Surgical History:  Procedure Laterality Date  . CHOLECYSTECTOMY    . ENDOMETRIAL ABLATION    . TUBAL LIGATION     Family History:  Family History  Problem Relation Age of Onset  . Diabetes Mother   . Hypertension Mother   . Cancer Mother        pancreatic  . Diabetes Father   . Hypertension Father   . Cancer Father        colon  . Diabetes Sister    Family Psychiatric  History: See admission H&P Social History:  Social History   Substance and Sexual Activity  Alcohol Use Yes   Comment: Pt reported recently drinking up to 1/5  of liquor per day     Social History   Substance and Sexual Activity  Drug Use No    Social History   Socioeconomic History  . Marital status: Married    Spouse name: [name]  . Number of children: Not on file  . Years of education: Not on file  . Highest education level: Not on file  Occupational History  . Occupation: Unemployed  Social Needs  . Financial resource strain: Not on file  . Food insecurity:    Worry: Not on file    Inability: Not on file  . Transportation needs:    Medical: Not on file    Non-medical: Not on file  Tobacco Use  . Smoking status: Current Every Day Smoker    Packs/day: 0.50    Types: Cigarettes  . Smokeless tobacco: Never Used  Substance and Sexual Activity  . Alcohol use:  Yes    Comment: Pt reported recently drinking up to 1/5 of liquor per day  . Drug use: No  . Sexual activity: Yes    Birth control/protection: Surgical  Lifestyle  . Physical activity:    Days per week: Not on file    Minutes per session: Not on file  . Stress: Not on file  Relationships  . Social connections:    Talks on phone: Not on file    Gets together: Not on file    Attends religious service: Not on file    Active member of club or organization: Not on file    Attends meetings of clubs or organizations: Not on file    Relationship status: Not on file  Other Topics Concern  . Not on file  Social History Narrative   Pt lives with husband [name] in [place]; unemployed.  Followed by [name].   Additional Social History:    Pain Medications: See MAR Prescriptions: See MAR Over the Counter: See MAR History of alcohol / drug use?: Yes Name of Substance 1: Alcohol 1 - Amount (size/oz): up to 1/5 of liquor 1 - Frequency: Episodic 1 - Duration: several months 1 - Last Use / Amount: 01/19/2019  Sleep: Fair  Appetite:  Fair  Current Medications: Current Facility-Administered Medications  Medication Dose Route Frequency Provider Last Rate Last Dose  . acetaminophen (TYLENOL) tablet 650 mg  650 mg Oral Q6H PRN Antonieta Pert, MD   650 mg at 01/26/19 1116  . ALPRAZolam Prudy Feeler) tablet 1 mg  1 mg Oral QID PRN Antonieta Pert, MD   1 mg at 01/26/19 1259  . folic acid (FOLVITE) tablet 1 mg  1 mg Oral Daily Antonieta Pert, MD   1 mg at 01/26/19 0754  . hydrOXYzine (ATARAX/VISTARIL) tablet 25 mg  25 mg Oral Q4H PRN Antonieta Pert, MD   25 mg at 01/23/19 1556  . metoprolol tartrate (LOPRESSOR) tablet 12.5 mg  12.5 mg Oral BID Antonieta Pert, MD   12.5 mg at 01/26/19 0754  . nicotine (NICODERM CQ - dosed in mg/24 hours) patch 21 mg  21 mg Transdermal Daily Antonieta Pert, MD   21 mg at 01/26/19 0757  . pneumococcal 23 valent vaccine (PNU-IMMUNE) injection  0.5 mL  0.5 mL Intramuscular Tomorrow-1000 Antonieta Pert, MD      . QUEtiapine (SEROQUEL) tablet 200 mg  200 mg Oral QHS Antonieta Pert, MD   200 mg at 01/25/19 2148  . rOPINIRole (REQUIP) tablet 3 mg  3 mg Oral QHS Antonieta Pert,  MD   3 mg at 01/25/19 2148  . thiamine (VITAMIN B-1) tablet 100 mg  100 mg Oral Daily Antonieta Pert, MD   100 mg at 01/26/19 0755  . traZODone (DESYREL) tablet 50 mg  50 mg Oral QHS PRN Antonieta Pert, MD       Lab Results:  No results found for this or any previous visit (from the past 48 hour(s)).  Blood Alcohol level:  Lab Results  Component Value Date   ETH <10 01/23/2019   ETH <5 07/17/2017   Metabolic Disorder Labs: Lab Results  Component Value Date   HGBA1C 5.5 01/23/2019   MPG 111.15 01/23/2019   MPG 100 07/19/2017   No results found for: PROLACTIN Lab Results  Component Value Date   CHOL 196 01/23/2019   TRIG 186 (H) 01/23/2019   HDL 36 (L) 01/23/2019   CHOLHDL 5.4 01/23/2019   VLDL 37 01/23/2019   LDLCALC 123 (H) 01/23/2019   LDLCALC 99 07/19/2017    Physical Findings: AIMS:  , ,  ,  ,    CIWA:  CIWA-Ar Total: 3 COWS:     Musculoskeletal: Strength & Muscle Tone: within normal limits Gait & Station: normal Patient leans: N/A  Psychiatric Specialty Exam: Physical Exam  Nursing note and vitals reviewed. Constitutional: She is oriented to person, place, and time. She appears well-developed and well-nourished.  HENT:  Head: Normocephalic and atraumatic.  Respiratory: Effort normal.  Neurological: She is alert and oriented to person, place, and time.    Review of Systems  Respiratory: Negative for cough and shortness of breath.   Cardiovascular: Negative for chest pain and palpitations.  Gastrointestinal: Negative.  Negative for abdominal pain, heartburn, nausea and vomiting.  Neurological: Negative.  Negative for dizziness and headaches.  Psychiatric/Behavioral: Positive for depression and substance abuse  (Benzodizepine use, chronic). Negative for hallucinations and suicidal ideas. The patient is nervous/anxious and has insomnia.     Blood pressure 135/85, pulse (!) 103, temperature 98.6 F (37 C), temperature source Oral, resp. rate 20, height 5\' 5"  (1.651 m), weight 69.9 kg, SpO2 100 %.Body mass index is 25.63 kg/m.  General Appearance: Casual  Eye Contact:  Fair  Speech:  Normal Rate  Volume:  Normal  Mood:  Anxious and Irritable  Affect:  Congruent  Thought Process:  Coherent and Descriptions of Associations: Circumstantial  Orientation:  Full (Time, Place, and Person)  Thought Content:  Logical  Suicidal Thoughts:  No  Homicidal Thoughts:  No  Memory:  Immediate;   Fair Recent;   Fair Remote;   Fair  Judgement:  Impaired  Insight:  Lacking  Psychomotor Activity:  Increased  Concentration:  Concentration: Fair and Attention Span: Fair  Recall:  [employer] of Knowledge:  Fair  Language:  Fair  Akathisia:  Negative  Handed:  Right  AIMS (if indicated):     Assets:  Desire for Improvement Housing Physical Health Resilience Social Support  ADL's:  Intact  Cognition:  WNL  Sleep:  Number of Hours: 6.75   Treatment Plan Summary: Daily contact with patient to assess and evaluate symptoms and progress in treatment, Medication management and Plan : Patient is seen and examined.  Patient is a 53 year old female with the above-stated past psychiatric history who is seen in follow-up.  She remains significantly anxious, and worried about her Xanax being removed.  We have discussed this extensively, and we will continue her Xanax.  We will leave it up to Dr.Kaur to decide  whether or not she wants her to continue on the the Xanax.  Her blood pressure and heart rate have been elevated, and I will add low-dose metoprolol to see if that can help.  She denied any auditory or visual hallucinations.  We will continue the Seroquel given the success in helping her sleep.  We will continue to  monitor for withdrawal symptoms.  - Continue inpatient hospitalization.  - Will continue today 01/26/2019 plan as below except where it is noted.  1.  Continue Xanax 1 mg p.o. 4 times daily as needed anxiety.      - Initiated Propranolol 10 mg po tid for tremors, anxiety.      - Administer Seroquel 50 mg once (now) for agitation.  2.  Continue folic acid 1 mg p.o. daily for nutritional supplementation.  3.  Continue hydroxyzine 25 mg p.o. every 4 hours as needed anxiety.  4.  Increased Seroquel  From 200 mg p.o. nightly to Seroquel  250 mg po Q hs.for mood stability, psychosis and sleep.  5.  Continue Requip 3 mg p.o. nightly for restless leg syndrome.  6.  Continue thiamine 100 mg p.o. daily for nutritional supplementation.  7.  Continue trazodone 50 mg p.o. nightly as needed insomnia.  8.  Start metoprolol 12.5 mg p.o. twice daily for hypertension and tachycardia.  9.  Disposition planning-in progress.  10. Patient will attend & participate in the  Group sessions.  Armandina StammerAgnes Hilliard Borges, NP, PMHNP, FNP_BC. 01/26/2019, 2:22 PM  Patient ID: Joanna PerchesBeverly A Simpson, female   DOB: 03/12/1966, 53 y.o.   MRN: [id number]

## 2019-01-27 MED ORDER — NITROGLYCERIN 0.4 MG SL SUBL
0.4000 mg | SUBLINGUAL_TABLET | SUBLINGUAL | Status: DC | PRN
  Administered 2019-01-27: 0.4 mg via SUBLINGUAL
  Filled 2019-01-27: qty 1

## 2019-01-27 NOTE — [facility] Group Notes (Signed)
[facility] Group Notes:  (Nursing/MHT/Case Management/Adjunct)  Date:  01/27/2019  Time:  1:58 PM  Type of Therapy:  Psychoeducational Skills  Participation Level:  Did Not Attend  Participation Quality:  Did not attend  Affect:  Did not attend  Cognitive:  Did not attend  Insight:  None  Engagement in Group:  Did not attend  Modes of Intervention:  Did not attend  Summary of Progress/Problems: Pt did not attend Psychoeducational group with topic healthy support systems.   Jacquelyne Balint Shanta 01/27/2019, 1:58 PM

## 2019-01-27 NOTE — Progress Notes (Signed)
D:  Patient's self inventory sheet, patient sleeps good, sleep medication helpful.  Fair appetite, low energy level, poor concentration.  Rated depression, hopeless and anxiety 2.  Denied withdrawals.  Physical problems, headaches.  Denied physical pain.  Goal "that I'm not ready to go home right now.  Feel like I need more treatment."  No discharge plans. A:  Medications administered per MD orders.  Emotional support and encouragement given patient. R:  Denied SI and HI while talking to nurse today.  Denied A/V hallucinations.  Safety maintained with 15 minute checks.

## 2019-01-27 NOTE — [facility] Group Notes (Signed)
[facility] LCSW Group Therapy Note  01/27/2019  10:00-11:00AM  Type of Therapy and Topic:  Group Therapy:  Grief   Participation Level:  Minimal   Description of Group:  Patients in this group were introduced to the idea that, just as anger is a secondary emotion, grief is  a primary emotion.  They learned about one model of grief which maintains that there are four tasks of mourning, including (1) accepting the reality of the loss, (2) working through the pain of grief, (3) adjusting to life without the deceased, and (4) maintaining a connection to the deceased while moving on with life.  This led to a discussion about patients' grief and what their experiences have been in going through this process.  Therapeutic Goals: 1)  learn about the four tasks of mourning 2)  discuss patients' direct experiences in going through these tasks  3)  give an opportunity to write a list of regrets, resentments, and appreciations of a specific person lost   4)  allow sharing and discuss rituals that could possibly help in the grief process such as burying or burning the list, tearing it up and scattering it, and more   Summary of Patient Progress:  The patient's most impactful loss in her life has been her father, which happened 15 years ago.  The patient expressed little during group, crying a great deal and choosing to "pass" when it was her turn to talk.  Therapeutic Modalities:   Motivational Interviewing Activity  Lynnell Chad

## 2019-01-27 NOTE — Progress Notes (Signed)
[facility]  01/27/2019 12:09 PM Joanna PerchesBeverly A Simpson  MRN:  [id number]  Subjective: Joanna Simpson reports, "My chest is hurting right now. My blood pressure is up. I was in a happy mood earlier before this started. I was asking to be discharged earlier, I don't want to be discharged any more. I would like to stay here till I get better.  Objective: Patient is seen and examined.  Patient is a 53 year old female with the a well-stated past psychiatric history seen for follow-up today. She is lying down in her bed. She says she was feeling really happier earlier. Then she felt like her blood pressure was up, then her chest started hurting. She is in agreement to take some nitroglycerine 4 mg tablet. She felt better afterwards. A re-check of her vital signs reveals a normal blood pressure & pulse rates. She denies any shortness of breath. She denies any radiation of her chest pain. She denies any numbness to her extremities. Other than the complaints of the chest pain, Joanna Simpson does not appear to be in any distress. She does not appear to be responding to any internal stimuli.  She remains very focused on her Xanax.  We continued her Xanax at 1 mg p.o. 4 times daily.  She is stating that she did not overuse it earlier at her home and that she usually took it  only 3 times a day.  She stated she has a half a bottle of it at home.  She is highly offended when we discussed Xanax dependency. She was asking to be discharged from this hospital yesterday.  When we discussed the possibility of discharge on Monday, she revealed that she has a court [place] [facility] [place]. She says she does not want to be discharged tomorrow any more, wants to stay till the course of her treatment.  She denies any SIHI, AVH, delusional thoughts or paranoia. She is in agreement to continue her current plan of as already in progress.   Principal Problem: <principal problem not specified> Diagnosis:  Active Problems:   MDD (major depressive disorder), recurrent, severe, with psychosis (HCC)  Total Time spent with patient: 15 minutes  Past Psychiatric History: See admission H&P  Past Medical History:  Past Medical History:  Diagnosis Date  . Anxiety   . Hypertension   . Restless leg syndrome   . Tachycardia     Past Surgical History:  Procedure Laterality Date  . CHOLECYSTECTOMY    . ENDOMETRIAL ABLATION    . TUBAL LIGATION     Family History:  Family History  Problem Relation Age of Onset  . Diabetes Mother   . Hypertension Mother   . Cancer Mother        pancreatic  . Diabetes Father   . Hypertension Father   . Cancer Father        colon  . Diabetes Sister    Family Psychiatric  History: See admission H&P Social History:  Social History   Substance and Sexual Activity  Alcohol Use Yes   Comment: Pt reported recently drinking up to 1/5 of liquor per day     Social History   Substance and Sexual Activity  Drug Use No    Social History   Socioeconomic History  . Marital status: Married    Spouse name: Mellody DanceKeith  . Number of children: Not on file  . Years of education: Not on file  . Highest education level: Not on  file  Occupational History  . Occupation: Unemployed  Social Needs  . Financial resource strain: Not on file  . Food insecurity:    Worry: Not on file    Inability: Not on file  . Transportation needs:    Medical: Not on file    Non-medical: Not on file  Tobacco Use  . Smoking status: Current Every Day Smoker    Packs/day: 0.50    Types: Cigarettes  . Smokeless tobacco: Never Used  Substance and Sexual Activity  . Alcohol use: Yes    Comment: Pt reported recently drinking up to 1/5 of liquor per day  . Drug use: No  . Sexual activity: Yes    Birth control/protection: Surgical  Lifestyle  . Physical activity:    Days per week: Not on file    Minutes per session: Not on file  . Stress: Not on file  Relationships  . Social  connections:    Talks on phone: Not on file    Gets together: Not on file    Attends religious service: Not on file    Active member of club or organization: Not on file    Attends meetings of clubs or organizations: Not on file    Relationship status: Not on file  Other Topics Concern  . Not on file  Social History Narrative   Pt lives with husband [name] in [place]; unemployed.  Followed by [name].   Additional Social History:    Pain Medications: See MAR Prescriptions: See MAR Over the Counter: See MAR History of alcohol / drug use?: Yes Name of Substance 1: Alcohol 1 - Amount (size/oz): up to 1/5 of liquor 1 - Frequency: Episodic 1 - Duration: several months 1 - Last Use / Amount: 01/19/2019  Sleep: Good  Appetite:  Fair  Current Medications: Current Facility-Administered Medications  Medication Dose Route Frequency Provider Last Rate Last Dose  . acetaminophen (TYLENOL) tablet 650 mg  650 mg Oral Q6H PRN Antonieta Pert, MD   650 mg at 01/27/19 1206  . ALPRAZolam Prudy Feeler) tablet 1 mg  1 mg Oral QID PRN Antonieta Pert, MD   1 mg at 01/27/19 0901  . folic acid (FOLVITE) tablet 1 mg  1 mg Oral Daily Antonieta Pert, MD   1 mg at 01/27/19 0856  . hydrOXYzine (ATARAX/VISTARIL) tablet 25 mg  25 mg Oral Q4H PRN Antonieta Pert, MD   25 mg at 01/27/19 1110  . metoprolol tartrate (LOPRESSOR) tablet 12.5 mg  12.5 mg Oral BID Antonieta Pert, MD   12.5 mg at 01/27/19 0857  . nicotine (NICODERM CQ - dosed in mg/24 hours) patch 21 mg  21 mg Transdermal Daily Antonieta Pert, MD   21 mg at 01/27/19 0857  . nitroGLYCERIN (NITROSTAT) SL tablet 0.4 mg  0.4 mg Sublingual Q5 min PRN Armandina Stammer I, NP   0.4 mg at 01/27/19 1202  . pneumococcal 23 valent vaccine (PNU-IMMUNE) injection 0.5 mL  0.5 mL Intramuscular Tomorrow-1000 Antonieta Pert, MD      . propranolol (INDERAL) tablet 10 mg  10 mg Oral TID Armandina Stammer I, NP   10 mg at 01/27/19 1110  . QUEtiapine  (SEROQUEL) tablet 250 mg  250 mg Oral QHS Armandina Stammer I, NP   250 mg at 01/26/19 2117  . rOPINIRole (REQUIP) tablet 3 mg  3 mg Oral QHS Antonieta Pert, MD   3 mg at 01/26/19 2117  . thiamine (VITAMIN B-1) tablet  100 mg  100 mg Oral Daily Antonieta Pertlary, Greg Lawson, MD   100 mg at 01/27/19 0857  . traZODone (DESYREL) tablet 50 mg  50 mg Oral QHS PRN Antonieta Pertlary, Greg Lawson, MD       Lab Results:  No results found for this or any previous visit (from the past 48 hour(s)).  Blood Alcohol level:  Lab Results  Component Value Date   ETH <10 01/23/2019   ETH <5 07/17/2017   Metabolic Disorder Labs: Lab Results  Component Value Date   HGBA1C 5.5 01/23/2019   MPG 111.15 01/23/2019   MPG 100 07/19/2017   No results found for: PROLACTIN Lab Results  Component Value Date   CHOL 196 01/23/2019   TRIG 186 (H) 01/23/2019   HDL 36 (L) 01/23/2019   CHOLHDL 5.4 01/23/2019   VLDL 37 01/23/2019   LDLCALC 123 (H) 01/23/2019   LDLCALC 99 07/19/2017    Physical Findings: AIMS:  , ,  ,  ,    CIWA:  CIWA-Ar Total: 3 COWS:     Musculoskeletal: Strength & Muscle Tone: within normal limits Gait & Station: normal Patient leans: N/A  Psychiatric Specialty Exam: Physical Exam  Nursing note and vitals reviewed. Constitutional: She is oriented to person, place, and time. She appears well-developed and well-nourished.  HENT:  Head: Normocephalic and atraumatic.  Respiratory: Effort normal.  Neurological: She is alert and oriented to person, place, and time.    Review of Systems  Respiratory: Negative for cough and shortness of breath.   Cardiovascular: Negative for chest pain and palpitations.  Gastrointestinal: Negative.  Negative for abdominal pain, heartburn, nausea and vomiting.  Neurological: Negative.  Negative for dizziness and headaches.  Psychiatric/Behavioral: Positive for depression and substance abuse (Benzodizepine use, chronic). Negative for hallucinations and suicidal ideas. The  patient is nervous/anxious and has insomnia.     Blood pressure (!) 166/113, pulse 100, temperature (!) 97.5 F (36.4 C), temperature source Oral, resp. rate 18, height 5\' 5"  (1.651 m), weight 69.9 kg, SpO2 96 %.Body mass index is 25.63 kg/m.  General Appearance: Casual  Eye Contact:  Fair  Speech:  Normal Rate  Volume:  Normal  Mood:  Anxious and Irritable  Affect:  Congruent  Thought Process:  Coherent and Descriptions of Associations: Circumstantial  Orientation:  Full (Time, Place, and Person)  Thought Content:  Logical  Suicidal Thoughts:  No  Homicidal Thoughts:  No  Memory:  Immediate;   Fair Recent;   Fair Remote;   Fair  Judgement:  Impaired  Insight:  Lacking  Psychomotor Activity:  Increased  Concentration:  Concentration: Fair and Attention Span: Fair  Recall:  [employer]Fair  Fund of Knowledge:  Fair  Language:  Fair  Akathisia:  Negative  Handed:  Right  AIMS (if indicated):     Assets:  Desire for Improvement Housing Physical Health Resilience Social Support  ADL's:  Intact  Cognition:  WNL  Sleep:  Number of Hours: 6.75   Treatment Plan Summary: Daily contact with patient to assess and evaluate symptoms and progress in treatment, Medication management and Plan : Patient is seen and examined.  Patient is a 53 year old female with the above-stated past psychiatric history who is seen in follow-up.  She remains significantly anxious and worried about her Xanax being removed.  We have discussed this extensively, and we will continue her Xanax.  We will leave it up to Dr.Kaur to decide whether or not she wants her to continue on the the Xanax.  Her blood pressure and heart rate have been elevated, a low-dose metoprolol was added to see if that can help. She was started on propranolol to aid with her increased anxiety, heart rate & blood pressure rate,  She denied any auditory or visual hallucinations.  We will continue the Seroquel given the success in helping her sleep.  We  will continue to monitor for withdrawal symptoms. She complains of chest pain, received NTG. Denies any pain radiation, denies any shortness of breath. Denies any numbness to her extremity.  - Continue inpatient hospitalization.  - Will continue today 01/27/2019 plan as below except where it is noted.  1.  Continue Xanax 1 mg p.o. 4 times daily as needed anxiety.      - Continue Propranolol 10 mg po tid for tremors, anxiety.      - Administer Seroquel 50 mg once (now) for agitation (completed).  2.  Continue folic acid 1 mg p.o. daily for nutritional supplementation.  3.  Continue hydroxyzine 25 mg p.o. every 4 hours as needed anxiety.  4.  Continue Seroquel  250 mg po Q hs.for mood stability, psychosis and sleep.  5.  Continue Requip 3 mg p.o. nightly for restless leg syndrome.  6.  Continue thiamine 100 mg p.o. daily for nutritional supplementation.  7.  Continue trazodone 50 mg p.o. nightly as needed insomnia.  8.  Start metoprolol 12.5 mg p.o. twice daily for hypertension and tachycardia.  9.  Disposition planning-in progress.  10. Patient will attend & participate in the  Group sessions.  Armandina Stammer, NP, PMHNP, FNP_BC. 01/27/2019, 12:09 PM  Patient ID: Joanna Simpson, female   DOB: 1966-10-29, 53 y.o.   MRN: [id number]

## 2019-01-27 NOTE — Plan of Care (Signed)
Nurse discussed anxiety, depression, coping skills with patient. 

## 2019-01-28 MED ORDER — SERTRALINE HCL 25 MG PO TABS
25.0000 mg | ORAL_TABLET | Freq: Every day | ORAL | Status: DC
  Administered 2019-01-28 – 2019-01-30 (×3): 25 mg via ORAL
  Filled 2019-01-28 (×4): qty 1

## 2019-01-28 NOTE — Progress Notes (Signed)
DAR NOTE: Pt present with flat affect and depressed mood in the unit. Pt complained about her room mate keeping the room hot and she can not take and feeling strangling her room mate. Pt has been anxious and tearful, she has been in the dayroom most of the day. Pt denies physical pain, took all her meds as scheduled.  Pt's safety ensured with 15 minute and environmental checks. Pt currently denies SI/HI and A/V hallucinations. Pt verbally agrees to seek staff if SI/HI or A/VH occurs and to consult with staff before acting on these thoughts. Will continue POC.

## 2019-01-28 NOTE — Progress Notes (Signed)
D: Patient observed in dayroom all evening watching Super Bowl and socializing. Patient states, "I'd like to see a Training and development officer. I'd like to get my discharge plan going. I'm ready to get home and see my grandbabies." Patient's affect animated, mood anxious. Denies pain, physical complaints.   A: Medicated per orders, prn xanax given per order/her request. Medication education provided. Level III obs in place for safety. Emotional support offered. Patient encouraged to complete Suicide Safety Plan before discharge. Encouraged to attend and participate in unit programming.    R: Patient verbalizes understanding of POC. On reassess, patient resting in bed. Patient denies SI/HI/AVH and remains safe on level III obs. Will continue to monitor throughout the night.

## 2019-01-28 NOTE — [facility] Group Notes (Signed)
LCSW Group Therapy Note 01/28/2019 12:46 PM  Type of Therapy and Topic: Group Therapy: Overcoming Obstacles  Participation Level: Active  Description of Group:  In this group patients will be encouraged to explore what they see as obstacles to their own wellness and recovery. They will be guided to discuss their thoughts, feelings, and behaviors related to these obstacles. The group will process together ways to cope with barriers, with attention given to specific choices patients can make. Each patient will be challenged to identify changes they are motivated to make in order to overcome their obstacles. This group will be process-oriented, with patients participating in exploration of their own experiences as well as giving and receiving support and challenge from other group members.  Therapeutic Goals: 1. Patient will identify personal and current obstacles as they relate to admission. 2. Patient will identify barriers that currently interfere with their wellness or overcoming obstacles.  3. Patient will identify feelings, thought process and behaviors related to these barriers. 4. Patient will identify two changes they are willing to make to overcome these obstacles:   Summary of Patient Progress   Joanna Simpson was engaged and participated throughout the group session. Joanna Simpson reports that her main obstacle is "myself". 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 It has become so bad that I chose to distance myself from my family". Joanna Simpson reports that she does not know how she will overcome this obstacle at this time.    Therapeutic Modalities:  Cognitive Behavioral Therapy Solution Focused Therapy Motivational Interviewing Relapse Prevention Therapy   Alcario Drought Clinical Social Worker

## 2019-01-28 NOTE — Progress Notes (Signed)
Recreation Therapy Notes  Date:  2.3.20 Time: 0930 Location: [address] Dayroom  Group Topic: Stress Management  Goal Area(s) Addresses:  Patient will identify positive stress management techniques. Patient will identify benefits of using stress management post d/c.  Intervention:  Stress Management  Activity :  Guided Imagery.  LRT introduced the stress management technique of guided imagery.  LRT read Simpson script that took patients on Simpson journey to the beach at sunrise.  Patients were to listen and follow along as script was read by LRT to engage in activity.  Education:  Stress Management, Discharge Planning.   Education Outcome: Acknowledges Education  Clinical Observations/Feedback: Pt did not attend group.      Joanna Simpson, LRT/CTRS         Joanna Simpson 01/28/2019 10:58 AM 

## 2019-01-28 NOTE — Progress Notes (Signed)
[facility] MD Progress Note  01/28/2019 12:40 PM Joanna Simpson  MRN:  [id number] Subjective:  "I'm doing fine today."  Ms. Befort found socializing in the dayroom. Continues to appear depressed. Reports she wants to go home but states she needs to feel better first. Reports several stressors that contributed to this admission- her son separated from his wife in November and they found out one of his children is not his biological child. She has also been helping with a grandchild who has special needs. She reports some guilt/depression related to abortion years ago as well. Reports depressed mood with low energy and anhedonia. She denies VH of "little man" for last two days. Denies SI. States sleep is improving with Seroquel.  From admission H&P: Patient is a 53 year old female with a past psychiatric history reportedly for depression, anxiety, probable alcohol dependence and Xanax dependence who presented as a walk-in to the behavioral health hospital on 01/23/2019. The patient presented with her husband because of recent "blackouts" as well as what was reported to self-injurious behavior. She also admitted to hallucinations. She was accompanied by her husband. The patient has a previous psychiatric admission to our facility in 2018 after an intentional overdose. She is followed by [name]. She is prescribed Xanax 1 mg p.o. 4 times daily and has basically received 120 tablets on a regular basis for an extended period of time. The patient also reported having had either dissociative episodes or amnestic episodes where she could not remember what occurred over a period of time. She also had self-injurious behaviors during that time. During the blackout. She would scratch her eyes, scratch her chest, put her head under water in the bathtub. She had no memory of these events. She admitted to helplessness, hopelessness and worthlessness as well as insomnia. She stated that she needed something  "strong" because she does not sleep. She stated her visual hallucinations was of a little man who urged her to harm herself.   Principal Problem: <principal problem not specified> Diagnosis: Active Problems:   MDD (major depressive disorder), recurrent, severe, with psychosis (HCC)  Total Time spent with patient: 15 minutes  Past Psychiatric History: See admission H&P  Past Medical History:  Past Medical History:  Diagnosis Date  . Anxiety   . Hypertension   . Restless leg syndrome   . Tachycardia     Past Surgical History:  Procedure Laterality Date  . CHOLECYSTECTOMY    . ENDOMETRIAL ABLATION    . TUBAL LIGATION     Family History:  Family History  Problem Relation Age of Onset  . Diabetes Mother   . Hypertension Mother   . Cancer Mother        pancreatic  . Diabetes Father   . Hypertension Father   . Cancer Father        colon  . Diabetes Sister    Family Psychiatric  History: See admission H&P Social History:  Social History   Substance and Sexual Activity  Alcohol Use Yes   Comment: Pt reported recently drinking up to 1/5 of liquor per day     Social History   Substance and Sexual Activity  Drug Use No    Social History   Socioeconomic History  . Marital status: Married    Spouse name: [name]  . Number of children: Not on file  . Years of education: Not on file  . Highest education level: Not on file  Occupational History  . Occupation: Unemployed  Social Needs  .  Financial resource strain: Not on file  . Food insecurity:    Worry: Not on file    Inability: Not on file  . Transportation needs:    Medical: Not on file    Non-medical: Not on file  Tobacco Use  . Smoking status: Current Every Day Smoker    Packs/day: 0.50    Types: Cigarettes  . Smokeless tobacco: Never Used  Substance and Sexual Activity  . Alcohol use: Yes    Comment: Pt reported recently drinking up to 1/5 of liquor per day  . Drug use: No  . Sexual activity: Yes     Birth control/protection: Surgical  Lifestyle  . Physical activity:    Days per week: Not on file    Minutes per session: Not on file  . Stress: Not on file  Relationships  . Social connections:    Talks on phone: Not on file    Gets together: Not on file    Attends religious service: Not on file    Active member of club or organization: Not on file    Attends meetings of clubs or organizations: Not on file    Relationship status: Not on file  Other Topics Concern  . Not on file  Social History Narrative   Pt lives [place]ith [place]; unemployed.  Followed by Milagros Evenerupinder Kaur.   Additional Social History:    Pain Medications: See MAR Prescriptions: See MAR Over the Counter: See MAR History of alcohol / drug use?: Yes Name of Substance 1: Alcohol 1 - Amount (size/oz): up to 1/5 of liquor 1 - Frequency: Episodic 1 - Duration: several months 1 - Last Use / Amount: 01/19/2019                  Sleep: Good  Appetite:  Good  Current Medications: Current Facility-Administered Medications  Medication Dose Route Frequency Provider Last Rate Last Dose  . acetaminophen (TYLENOL) tablet 650 mg  650 mg Oral Q6H PRN Antonieta Pertlary, Greg Lawson, MD   650 mg at 01/27/19 1206  . ALPRAZolam Prudy Feeler(XANAX) tablet 1 mg  1 mg Oral QID PRN Antonieta Pertlary, Greg Lawson, MD   1 mg at 01/28/19 0820  . folic acid (FOLVITE) tablet 1 mg  1 mg Oral Daily Antonieta Pertlary, Greg Lawson, MD   1 mg at 01/28/19 0816  . hydrOXYzine (ATARAX/VISTARIL) tablet 25 mg  25 mg Oral Q4H PRN Antonieta Pertlary, Greg Lawson, MD   25 mg at 01/27/19 1110  . metoprolol tartrate (LOPRESSOR) tablet 12.5 mg  12.5 mg Oral BID Antonieta Pertlary, Greg Lawson, MD   12.5 mg at 01/28/19 0817  . nicotine (NICODERM CQ - dosed in mg/24 hours) patch 21 mg  21 mg Transdermal Daily Antonieta Pertlary, Greg Lawson, MD   21 mg at 01/28/19 0816  . nitroGLYCERIN (NITROSTAT) SL tablet 0.4 mg  0.4 mg Sublingual Q5 min PRN Armandina StammerNwoko, Agnes I, NP   0.4 mg at 01/27/19 1202  . pneumococcal 23 valent  vaccine (PNU-IMMUNE) injection 0.5 mL  0.5 mL Intramuscular Tomorrow-1000 Antonieta Pertlary, Greg Lawson, MD      . propranolol (INDERAL) tablet 10 mg  10 mg Oral TID Armandina StammerNwoko, Agnes I, NP   10 mg at 01/28/19 0817  . QUEtiapine (SEROQUEL) tablet 250 mg  250 mg Oral QHS Armandina StammerNwoko, Agnes I, NP   250 mg at 01/27/19 2221  . rOPINIRole (REQUIP) tablet 3 mg  3 mg Oral QHS Antonieta Pertlary, Greg Lawson, MD   3 mg at 01/27/19 2221  . sertraline (  ZOLOFT) tablet 25 mg  25 mg Oral Daily Antonieta Pert, MD   25 mg at 01/28/19 9678  . thiamine (VITAMIN B-1) tablet 100 mg  100 mg Oral Daily Antonieta Pert, MD   100 mg at 01/28/19 0816  . traZODone (DESYREL) tablet 50 mg  50 mg Oral QHS PRN Antonieta Pert, MD        Lab Results: No results found for this or any previous visit (from the past 48 hour(s)).  Blood Alcohol level:  Lab Results  Component Value Date   ETH <10 01/23/2019   ETH <5 07/17/2017    Metabolic Disorder Labs: Lab Results  Component Value Date   HGBA1C 5.5 01/23/2019   MPG 111.15 01/23/2019   MPG 100 07/19/2017   No results found for: PROLACTIN Lab Results  Component Value Date   CHOL 196 01/23/2019   TRIG 186 (H) 01/23/2019   HDL 36 (L) 01/23/2019   CHOLHDL 5.4 01/23/2019   VLDL 37 01/23/2019   LDLCALC 123 (H) 01/23/2019   LDLCALC 99 07/19/2017    Physical Findings: AIMS: Facial and Oral Movements Muscles of Facial Expression: None, normal Lips and Perioral Area: None, normal Jaw: None, normal Tongue: None, normal,Extremity Movements Upper (arms, wrists, hands, fingers): None, normal Lower (legs, knees, ankles, toes): None, normal, Trunk Movements Neck, shoulders, hips: None, normal, Overall Severity Severity of abnormal movements (highest score from questions above): None, normal Incapacitation due to abnormal movements: None, normal Patient's awareness of abnormal movements (rate only patient's report): No Awareness, Dental Status Current problems with teeth and/or dentures?:  No Does patient usually wear dentures?: No  CIWA:  CIWA-Ar Total: 1 COWS:  COWS Total Score: 2  Musculoskeletal: Strength & Muscle Tone: within normal limits Gait & Station: normal Patient leans: N/A  Psychiatric Specialty Exam: Physical Exam  Nursing note and vitals reviewed. Constitutional: She is oriented to person, place, and time. She appears well-developed and well-nourished.  Cardiovascular: Normal rate.  Respiratory: Effort normal.  Neurological: She is alert and oriented to person, place, and time.    Review of Systems  Constitutional: Negative.   Respiratory: Sputum production: ETOH.   Psychiatric/Behavioral: Positive for depression and substance abuse. Negative for hallucinations, memory loss and suicidal ideas. The patient is not nervous/anxious and does not have insomnia.     Blood pressure 128/86, pulse (!) 105, temperature 98.4 F (36.9 C), temperature source Oral, resp. rate 20, height 5\' 5"  (1.651 m), weight 69.9 kg, SpO2 97 %.Body mass index is 25.63 kg/m.  General Appearance: Fairly Groomed  Eye Contact:  Fair  Speech:  Clear and Coherent  Volume:  Normal  Mood:  Depressed  Affect:  Congruent  Thought Process:  Coherent  Orientation:  Full (Time, Place, and Person)  Thought Content:  WDL  Suicidal Thoughts:  No  Homicidal Thoughts:  No  Memory:  Immediate;   Good Recent;   Poor  Judgement:  Fair  Insight:  Lacking  Psychomotor Activity:  Normal  Concentration:  Concentration: Fair  Recall:  [employer] of Knowledge:  Fair  Language:  Good  Akathisia:  No  Handed:  Right  AIMS (if indicated):     Assets:  Communication Skills Desire for Improvement Housing Resilience Social Support  ADL's:  Intact  Cognition:  WNL  Sleep:  Number of Hours: 6     Treatment Plan Summary: Daily contact with patient to assess and evaluate symptoms and progress in treatment and Medication management   Continue  inpatient hospitalization.  Start Zoloft 25 mg  PO daily for mood Continue Xanax 1 mg p.o. 4 times daily for anxiety Continue Propranolol 10 mg po tid for tremors, anxiety. Continue folic acid 1 mg p.o. daily for nutritional supplementation. Continue hydroxyzine 25 mg p.o. every 4 hours as needed anxiety. Continue Seroquel 250 mg po Q hs.for mood stability, psychosis and sleep. Continue Requip 3 mg p.o. nightly for restless leg syndrome. Continue thiamine 100 mg p.o. daily for nutritional supplementation. Continue trazodone 50 mg p.o. nightly as needed insomnia. Continue metoprolol 12.5 mg p.o. twice daily for hypertension and tachycardia  Patient will participate in the therapeutic group milieu.  Discharge disposition in progress.    Aldean BakerJanet E Jaisa Defino, NP 01/28/2019, 12:40 PM

## 2019-01-29 MED ORDER — PROPRANOLOL HCL 10 MG PO TABS: 10.0000 mg | ORAL_TABLET | Freq: Three times a day (TID) | ORAL | Status: DC | PRN

## 2019-01-29 NOTE — Progress Notes (Signed)
D: Patient observed up and social in the dayroom. Patient states, "I've had an up and down day. I definitely noticed I'm irritable and it's got to be the new medicine they started me on. I'm not usually like this. I definitely had some times where I had to walk away from situations." Review of chart indicates patient was started on zoloft this morning. Patient's affect animated, mood anxious. Speech rapid and pressured. Denies pain, physical complaints.   A: Medicated per orders, prn xanax given per her request. Medication education provided. Level III obs in place for safety. Emotional support offered. Patient encouraged to complete Suicide Safety Plan before discharge. Encouraged to attend and participate in unit programming.    R: Patient verbalizes understanding of POC. On reassess, patient was resting in bed. Patient denies SI/HI/AVH and remains safe on level III obs. Will continue to monitor throughout the night.

## 2019-01-29 NOTE — Progress Notes (Signed)
Patient attended grief and loss group facilitated by Burnis Kingfisher, Mdiv, [facility], and Ethel Rana, MS, [facility], [facility].   Group focuses on change and loss experienced by group members; topics include loss due to death, loss of relationships, loss of a place, loss of sense of self, etc. Group members are invited to share the changes and losses that are currently affecting their lives. Facilitators tie together shared experiences between group members and validate emotions felt by participants.  Patient Joanna Simpson attended and participated in group. Pt connected her experience caring for her ailing father with the experience of another group member. Pt reported she once felt guilt about her father's death and has gotten to a place where she no longer feels guilt but continues to feel frustration toward her family. Pt identified her faith as being important in her grief process.

## 2019-01-29 NOTE — Progress Notes (Signed)
[facility] MD Progress Note  01/29/2019 1:38 PM Joanna Simpson  MRN:  [id number] Subjective: Patient reports feeling better than she did on admission and currently is hoping for discharge soon.  She describes improving mood.  She states she felt somewhat irritable yesterday, which she attributes to Zoloft trial.  She had been experiencing visual hallucinations but states she has had no hallucinations for 2 to 3 days.  Currently denies medication side effects.   Objective: I have reviewed case with treatment team and have met with patient. 53 year old female, presented to hospital voluntarily on 1/29.  Reported episodes of "blackouts" and visual hallucinations, described as seeing a little man intermittently.  Patient reports these experiences have been relatively recent. She has a history of depression and of anxiety, follows up with Dr. Toy Care for outpatient psychiatric management.  Home medications included Xanax at 1 mg 4 times daily, phentermine, Requip.  Patient states she has been on Xanax for many years, denies abusing or misusing.  She acknowledges recent episode of alcohol consumption, but denies any pattern of alcohol abuse or dependence.  At this time she states she is feeling better.  She describes improving mood, denies any further or recent blackouts, states that visual hallucination as described above has resolved and has not experienced any hallucinatory experience in 2 to 3 days.  As she improves she is focused on being discharged soon.  With her expressed consent I have spoken with her husband, who has been visiting on unit, and who corroborates that patient seems improved.  Currently patient is fully alert, attentive, and oriented x3.  Recall is 3 out of 3 immediate and 2 out of 3 at 5 minutes.  No current evidence of delirium. Her vitals are currently stable-BP 121/94, pulse 88.  No noticeable psychomotor restlessness or distress.  No significant distal tremors.  Of note, 1/29 head CT  scan reported as negative.  No disruptive or agitated behaviors on unit.     Principal Problem:  MDD Diagnosis: Active Problems:   MDD (major depressive disorder), recurrent, severe, with psychosis ([place])  Total Time spent with patient: 20 minutes  Past Psychiatric History: See admission H&P  Past Medical History:  Past Medical History:  Diagnosis Date  . Anxiety   . Hypertension   . Restless leg syndrome   . Tachycardia     Past Surgical History:  Procedure Laterality Date  . CHOLECYSTECTOMY    . ENDOMETRIAL ABLATION    . TUBAL LIGATION     Family History:  Family History  Problem Relation Age of Onset  . Diabetes Mother   . Hypertension Mother   . Cancer Mother        pancreatic  . Diabetes Father   . Hypertension Father   . Cancer Father        colon  . Diabetes Sister    Family Psychiatric  History: See admission H&P Social History:  Social History   Substance and Sexual Activity  Alcohol Use Yes   Comment: Pt reported recently drinking up to 1/5 of liquor per day     Social History   Substance and Sexual Activity  Drug Use No    Social History   Socioeconomic History  . Marital status: Married    Spouse name: Lanny Hurst  . Number of children: Not on file  . Years of education: Not on file  . Highest education level: Not on file  Occupational History  . Occupation: Unemployed  Social Needs  . Financial  resource strain: Not on file  . Food insecurity:    Worry: Not on file    Inability: Not on file  . Transportation needs:    Medical: Not on file    Non-medical: Not on file  Tobacco Use  . Smoking status: Current Every Day Smoker    Packs/day: 0.50    Types: Cigarettes  . Smokeless tobacco: Never Used  Substance and Sexual Activity  . Alcohol use: Yes    Comment: Pt reported recently drinking up to 1/5 of liquor per day  . Drug use: No  . Sexual activity: Yes    Birth control/protection: Surgical  Lifestyle  . Physical activity:    Days  per week: Not on file    Minutes per session: Not on file  . Stress: Not on file  Relationships  . Social connections:    Talks on phone: Not on file    Gets together: Not on file    Attends religious service: Not on file    Active member of club or organization: Not on file    Attends meetings of clubs or organizations: Not on file    Relationship status: Not on file  Other Topics Concern  . Not on file  Social History Narrative   Pt lives with husband Lanny Hurst in [place]; unemployed.  Followed by Chucky May.   Additional Social History:    Pain Medications: See MAR Prescriptions: See MAR Over the Counter: See MAR History of alcohol / drug use?: Yes Name of Substance 1: Alcohol 1 - Amount (size/oz): up to 1/5 of liquor 1 - Frequency: Episodic 1 - Duration: several months 1 - Last Use / Amount: 01/19/2019  Sleep: improving  Appetite:  Good  Current Medications: Current Facility-Administered Medications  Medication Dose Route Frequency Provider Last Rate Last Dose  . acetaminophen (TYLENOL) tablet 650 mg  650 mg Oral Q6H PRN Sharma Covert, MD   650 mg at 01/27/19 1206  . ALPRAZolam Duanne Moron) tablet 1 mg  1 mg Oral QID PRN Sharma Covert, MD   1 mg at 01/29/19 0750  . folic acid (FOLVITE) tablet 1 mg  1 mg Oral Daily Sharma Covert, MD   1 mg at 01/29/19 0748  . hydrOXYzine (ATARAX/VISTARIL) tablet 25 mg  25 mg Oral Q4H PRN Sharma Covert, MD   25 mg at 01/27/19 1110  . metoprolol tartrate (LOPRESSOR) tablet 12.5 mg  12.5 mg Oral BID Sharma Covert, MD   12.5 mg at 01/29/19 0749  . nicotine (NICODERM CQ - dosed in mg/24 hours) patch 21 mg  21 mg Transdermal Daily Sharma Covert, MD   21 mg at 01/29/19 0749  . nitroGLYCERIN (NITROSTAT) SL tablet 0.4 mg  0.4 mg Sublingual Q5 min PRN Lindell Spar I, NP   0.4 mg at 01/27/19 1202  . pneumococcal 23 valent vaccine (PNU-IMMUNE) injection 0.5 mL  0.5 mL Intramuscular Tomorrow-1000 Sharma Covert, MD       . propranolol (INDERAL) tablet 10 mg  10 mg Oral TID Lindell Spar I, NP   10 mg at 01/29/19 1205  . QUEtiapine (SEROQUEL) tablet 250 mg  250 mg Oral QHS Lindell Spar I, NP   250 mg at 01/28/19 2212  . rOPINIRole (REQUIP) tablet 3 mg  3 mg Oral QHS Sharma Covert, MD   3 mg at 01/28/19 2211  . sertraline (ZOLOFT) tablet 25 mg  25 mg Oral Daily Sharma Covert, MD   25 mg  at 01/29/19 0749  . thiamine (VITAMIN B-1) tablet 100 mg  100 mg Oral Daily Sharma Covert, MD   100 mg at 01/29/19 0749  . traZODone (DESYREL) tablet 50 mg  50 mg Oral QHS PRN Sharma Covert, MD        Lab Results: No results found for this or any previous visit (from the past 48 hour(s)).  Blood Alcohol level:  Lab Results  Component Value Date   ETH <10 01/23/2019   ETH <5 50/53/9767    Metabolic Disorder Labs: Lab Results  Component Value Date   HGBA1C 5.5 01/23/2019   MPG 111.15 01/23/2019   MPG 100 07/19/2017   No results found for: PROLACTIN Lab Results  Component Value Date   CHOL 196 01/23/2019   TRIG 186 (H) 01/23/2019   HDL 36 (L) 01/23/2019   CHOLHDL 5.4 01/23/2019   VLDL 37 01/23/2019   LDLCALC 123 (H) 01/23/2019   LDLCALC 99 07/19/2017    Physical Findings: AIMS: Facial and Oral Movements Muscles of Facial Expression: None, normal Lips and Perioral Area: None, normal Jaw: None, normal Tongue: None, normal,Extremity Movements Upper (arms, wrists, hands, fingers): None, normal Lower (legs, knees, ankles, toes): None, normal, Trunk Movements Neck, shoulders, hips: None, normal, Overall Severity Severity of abnormal movements (highest score from questions above): None, normal Incapacitation due to abnormal movements: None, normal Patient's awareness of abnormal movements (rate only patient's report): No Awareness, Dental Status Current problems with teeth and/or dentures?: No Does patient usually wear dentures?: No  CIWA:  CIWA-Ar Total: 0 COWS:  COWS Total Score:  2  Musculoskeletal: Strength & Muscle Tone: within normal limits Gait & Station: normal Patient leans: N/A  Psychiatric Specialty Exam: Physical Exam  Nursing note and vitals reviewed. Constitutional: She is oriented to person, place, and time. She appears well-developed and well-nourished.  Cardiovascular: Normal rate.  Respiratory: Effort normal.  Neurological: She is alert and oriented to person, place, and time.    Review of Systems  Constitutional: Negative.   Respiratory: Sputum production: ETOH.   Psychiatric/Behavioral: Positive for depression and substance abuse. Negative for hallucinations, memory loss and suicidal ideas. The patient is not nervous/anxious and does not have insomnia.     Blood pressure (!) 121/94, pulse 88, temperature 97.9 F (36.6 C), temperature source Oral, resp. rate 17, height '5\' 5"'  (1.651 m), weight 69.9 kg, SpO2 97 %.Body mass index is 25.63 kg/m.  General Appearance: Improving grooming  Eye Contact:  Good  Speech:  Normal Rate  Volume:  Normal  Mood:  Reports mood is improved, currently minimizes depression  Affect:  Appropriate and Reactive, patient states she felt vaguely irritable yesterday.  No irritability noted at this time  Thought Process:  Linear and Descriptions of Associations: Intact  Orientation:  Other:  Fully alert and attentive  Thought Content:  No current hallucinations, does not appear internally preoccupied, no delusions expressed  Suicidal Thoughts:  No denies suicidal or self-injurious ideations, contracts for safety on unit  Homicidal Thoughts:  No  Memory:  Recent and remote grossly intact  Judgement:  Fair/improving  Insight:  Fair  Psychomotor Activity:  Normal-no psychomotor agitation or restlessness  Concentration:  Concentration: Good and Attention Span: Good  Recall:  Good  Fund of Knowledge:  Good  Language:  Good  Akathisia:  No  Handed:  Right  AIMS (if indicated):     Assets:  Communication  Skills Desire for Improvement Housing Resilience Social Support  ADL's:  Intact  Cognition:  WNL  Sleep:  Number of Hours: 6   Assessment- 53 year old female, presented to hospital voluntarily on 1/29.  Reported episodes of "blackouts" and visual hallucinations, described as seeing a little man intermittently.  Patient reports these experiences have been relatively recent. She has a history of depression and of anxiety, follows up with Dr. Toy Care for outpatient psychiatric management.  Home medications included Xanax at 1 mg 4 times daily, phentermine, Requip.  Patient states she has been on Xanax for many years, denies abusing or misusing.  She acknowledges recent episode of alcohol consumption, but denies any pattern of alcohol abuse or dependence.  Patient currently presents with significant improvement compared to admission.  No further amnestic episodes, currently alert, attentive, oriented x3.  No further visual hallucinations and no current psychotic symptoms are noted or reported.  Mood improving.  Denies suicidal ideations and is future oriented, hoping for discharge soon.  We have reviewed the negative impact that benzodiazepines, particularly at high doses and/or if combined with alcohol can have on cognition and its amnestic properties.  Patient not currently interested in benzodiazepine detox or taper, but encouraged to discuss this option with her outpatient psychiatrist.  Patient also advised to follow-up with a neurologist for further outpatient treatment/work-up if needed.  Treatment Plan Summary: Daily contact with patient to assess and evaluate symptoms and progress in treatment and Medication management  Treatment plan reviewed as below today 2/4 Continue Zoloft 25 mg daily for depression/anxiety Continue Xanax 1 mg  4 times daily for anxiety Continue Propranolol 10 mg three times a day PRN  for tremors, anxiety. Discontinue hydroxyzine, discontinue trazodone Decrease  Seroquel to 150 mgrs at nighttime, hold if sedated-for mood/psychotic symptoms Continue Requip 3 mg  nightly for restless leg syndrome. Continue metoprolol 12.5 mg twice daily  for hypertension and tachycardia   Jenne Campus, MD 01/29/2019, 1:38 PM   Patient ID: Jilda Panda, female   DOB: 09-Jun-1966, 53 y.o.   MRN: [id number]

## 2019-01-29 NOTE — Plan of Care (Signed)
D: Patient is alert, oriented, and cooperative. Patient affect is anxious. Patient denies SI, HI, AVH, and verbally contracts for safety. Patient reports feeling agitated and depressed. Patient reports she is not sleeping well. Patient denies physical symptoms/pain. Patient reports she does not feel ready to go home. Patient reports her "brain is like roller coaster" "like a machine"   A: Scheduled medications administered per MD order. PRN sleep medication administered per MD order. Support provided. Patient educated on safety on the unit and medications. Routine safety checks every 15 minutes. Patient stated understanding to tell nurse about any new physical symptoms. Patient understands to tell staff of any needs.     R: No adverse drug reactions noted. Patient verbally contracts for safety. Patient remains safe at this time and will continue to monitor.     Problem: Education: Goal: Knowledge of [facility] Education information/materials will improve Outcome: Progressing   Problem: Activity: Goal: Interest or engagement in activities will improve Outcome: Progressing   Problem: Safety: Goal: Periods of time without injury will increase Outcome: Progressing  Patient is oriented to the unit. Patient reports attending groups. Patient remains safe at this time.

## 2019-01-29 NOTE — Progress Notes (Signed)
Pt presents with a flat affect and a depressed mood. Pt noted to be irritable on approach. Pt expressed that she didn't like having a roommate that have a sitter watching over her in the room. Pt expressed that the sitter made her uncomfortable and that she wouldn't be able to sleep at night. Pt reported difficulty sleeping last night and requested to have sleep medicine increased. Pt informed Dr. Jama Flavors of the pt's complaints in treatment tx. Pt reported decreased depression and anxiety today. Pt denies SI/HI. Pt compliant with taking medications and no side effects to meds verbalized by pt.   Medications reviewed with pt. Medications administered as ordered per MD. Verbal support provided. Pt encouraged to attend groups. 15 minute checks performed for safety. Pt made a no roommate.   Pt compliant with tx plan.

## 2019-01-29 NOTE — Progress Notes (Signed)
The patient rated her day as a 5 out of a possible 10 since she stated that she had a "roller coaster day". She did not go into further detail.

## 2019-01-29 NOTE — Progress Notes (Signed)
Patient verbalized that she had a good family visit with her brother this evening. Her goal for tomorrow is to sleep better than she did last night.

## 2019-01-29 NOTE — Progress Notes (Signed)
Recreation Therapy Notes  Animal-Assisted Activity (AAA) Program Checklist/Progress Notes Patient Eligibility Criteria Checklist & Daily Group note for Rec Tx Intervention  Date: 2.4.20 Time: 1430 Location: [address] Dayroom   AAA/T Program Assumption of Risk Form signed by Patient/ or Parent Legal Guardian  YES   Patient is free of allergies or sever asthma   YES   Patient reports no fear of animals  YES   Patient reports no history of cruelty to animals  YES   Patient understands his/her participation is voluntary  YES   Patient washes hands before animal contact  YES   Patient washes hands after animal contact  YES   Behavioral Response: Engaged  Education: Hand Washing, Appropriate Animal Interaction   Education Outcome: Acknowledges understanding/In group clarification offered/Needs additional education.   Clinical Observations/Feedback: Pt attended and participated in group.    Joanna Simpson, LRT/CTRS         Joanna Simpson 01/29/2019 3:46 PM 

## 2019-01-30 MED ORDER — ALPRAZOLAM 1 MG PO TABS
1.0000 mg | ORAL_TABLET | Freq: Three times a day (TID) | ORAL | Status: DC | PRN
  Administered 2019-01-30 – 2019-01-31 (×4): 1 mg via ORAL
  Filled 2019-01-30 (×3): qty 1
  Filled 2019-01-30: qty 2

## 2019-01-30 MED ORDER — SERTRALINE HCL 25 MG PO TABS
25.0000 mg | ORAL_TABLET | Freq: Every day | ORAL | Status: DC
  Administered 2019-01-30 – 2019-01-31 (×2): 25 mg via ORAL
  Filled 2019-01-30 (×3): qty 1

## 2019-01-30 MED ORDER — QUETIAPINE FUMARATE 50 MG PO TABS: 250.0000 mg | ORAL_TABLET | Freq: Every day | ORAL | 0 refills | Status: DC

## 2019-01-30 MED ORDER — SERTRALINE HCL 25 MG PO TABS: 25.0000 mg | ORAL_TABLET | Freq: Every day | ORAL | 0 refills | Status: DC

## 2019-01-30 MED ORDER — ALPRAZOLAM 1 MG PO TABS: 1.0000 mg | ORAL_TABLET | Freq: Four times a day (QID) | ORAL | 0 refills | Status: DC | PRN

## 2019-01-30 MED ORDER — LURASIDONE HCL 20 MG PO TABS
20.0000 mg | ORAL_TABLET | Freq: Every day | ORAL | Status: DC
  Filled 2019-01-30 (×2): qty 1

## 2019-01-30 MED ORDER — METOPROLOL TARTRATE 25 MG PO TABS: 12.5000 mg | ORAL_TABLET | Freq: Two times a day (BID) | ORAL | 0 refills | Status: DC

## 2019-01-30 MED ORDER — TRAZODONE HCL 50 MG PO TABS: 50.0000 mg | ORAL_TABLET | Freq: Every evening | ORAL | 0 refills | Status: DC | PRN

## 2019-01-30 NOTE — Plan of Care (Signed)
Progress note  D: pt found in bed; compliant with medication administration. Pt continues to be irritable and defensive, especially with regard to questions or teaching on her medications that are controlled substances. Pt denies any physical symptoms or pain besides anxiety. "it's just a lot to deal with: mine and everyone else's problems". Pt denies si/hi/ah/vh and verbally agrees to approach staff if these become apparent or before harming herself or others while at [facility].  A: pt provided support and encouragement. Pt given medication per protocol and standing orders. Q6265m safety checks implemented and continued.  R: pt safe on the unit. Will continue to monitor.   Pt progressing in the following metrics  Problem: Education: Goal: Verbalization of understanding the information provided will improve Outcome: Progressing   Problem: Activity: Goal: Sleeping patterns will improve Outcome: Progressing   Problem: Health Behavior/Discharge Planning: Goal: Identification of resources available to assist in meeting health care needs will improve Outcome: Progressing Goal: Compliance with treatment plan for underlying cause of condition will improve Outcome: Progressing

## 2019-01-30 NOTE — Progress Notes (Signed)
The patient verbalized that she had a bad day overall but was pretty vague with her details except that she was experiencing swelling in her hands and was having headaches.

## 2019-01-30 NOTE — [facility] Group Notes (Signed)
[facility] Group Notes:  (Nursing/MHT/Case Management/Adjunct)  Date:  01/30/2019  Time:  3:15 pm  Type of Therapy:  Psychoeducational Skills  Participation Level:  Active  Participation Quality:  Appropriate  Affect:  Appropriate  Cognitive:  Appropriate  Insight:  Appropriate  Engagement in Group:  Engaged  Modes of Intervention:  Education  Summary of Progress/Problems: Patient alert and involved in group.    Joanna Simpson, [place] 01/30/2019, 8:06 PM

## 2019-01-30 NOTE — Progress Notes (Signed)
[facility] MD Progress Note  01/30/2019 10:46 AM Eira A Duff  MRN:  [id number] Subjective: Patient reports she continues to experience anxiety and mood symptoms.  In particular, describes a subjective sense of irritability, anger, and frequent/short-lived mood swings.  She also endorses some persistent anxiety.  Denies suicidal ideations.  States she has been visited regularly by her husband and that she told him yesterday that she was not feeling ready for discharge as of yet.  Does not endorse medication side effects at this time.  Denies any further amnestic or blackout episodes and denies any further/recent visual hallucinations. Denies suicidal ideations.   Objective: I have reviewed case with treatment team and have met with patient. 53-year-old female, presented to hospital voluntarily on 1/29.  Reported episodes of "blackouts" and visual hallucinations, described as seeing a little man intermittently.  Patient reports these experiences have been relatively recent. She has a history of depression and of anxiety, follows up with Dr. Kaur for outpatient psychiatric management.  Home medications included Xanax at 1 mg 4 times daily, phentermine, Requip.  Today patient presents vaguely depressed, dysphoric, irritable, although affect improves partially during session.  Denies suicidal plan or intention, contracts for safety.  Does not endorse medication side effects.  We have reviewed history.  Patient reports history of depression and history of mood instability/brief mood swings and episodes of increased impulsivity usually of short duration.  As noted in previous notes she has been on Xanax for many years for anxiety.  We reviewed medication history and today reports that Latuda was the most effective medication she has been on thus far, does not remember having had side effects.  No overtly disruptive or agitated behaviors on unit.  Visible in dayroom, cooperative on approach.    Principal  Problem:  MDD Diagnosis: Active Problems:   MDD (major depressive disorder), recurrent, severe, with psychosis (HCC)  Total Time spent with patient: 20 minutes  Past Psychiatric History: See admission H&P  Past Medical History:  Past Medical History:  Diagnosis Date  . Anxiety   . Hypertension   . Restless leg syndrome   . Tachycardia     Past Surgical History:  Procedure Laterality Date  . CHOLECYSTECTOMY    . ENDOMETRIAL ABLATION    . TUBAL LIGATION     Family History:  Family History  Problem Relation Age of Onset  . Diabetes Mother   . Hypertension Mother   . Cancer Mother        pancreatic  . Diabetes Father   . Hypertension Father   . Cancer Father        colon  . Diabetes Sister    Family Psychiatric  History: See admission H&P Social History:  Social History   Substance and Sexual Activity  Alcohol Use Yes   Comment: Pt reported recently drinking up to 1/5 of liquor per day     Social History   Substance and Sexual Activity  Drug Use No    Social History   Socioeconomic History  . Marital status: Married    Spouse name: Keith  . Number of children: Not on file  . Years of education: Not on file  . Highest education level: Not on file  Occupational History  . Occupation: Unemployed  Social Needs  . Financial resource strain: Not on file  . Food insecurity:    Worry: Not on file    Inability: Not on file  . Transportation needs:    Medical: Not on file      Non-medical: Not on file  Tobacco Use  . Smoking status: Current Every Day Smoker    Packs/day: 0.50    Types: Cigarettes  . Smokeless tobacco: Never Used  Substance and Sexual Activity  . Alcohol use: Yes    Comment: Pt reported recently drinking up to 1/5 of liquor per day  . Drug use: No  . Sexual activity: Yes    Birth control/protection: Surgical  Lifestyle  . Physical activity:    Days per week: Not on file    Minutes per session: Not on file  . Stress: Not on file   Relationships  . Social connections:    Talks on phone: Not on file    Gets together: Not on file    Attends religious service: Not on file    Active member of club or organization: Not on file    Attends meetings of clubs or organizations: Not on file    Relationship status: Not on file  Other Topics Concern  . Not on file  Social History Narrative   Pt lives with husband Lanny Hurst in [place]; unemployed.  Followed by Chucky May.   Additional Social History:    Pain Medications: See MAR Prescriptions: See MAR Over the Counter: See MAR History of alcohol / drug use?: Yes Name of Substance 1: Alcohol 1 - Amount (size/oz): up to 1/5 of liquor 1 - Frequency: Episodic 1 - Duration: several months 1 - Last Use / Amount: 01/19/2019  Sleep: improving  Appetite:  Good  Current Medications: Current Facility-Administered Medications  Medication Dose Route Frequency Provider Last Rate Last Dose  . acetaminophen (TYLENOL) tablet 650 mg  650 mg Oral Q6H PRN Sharma Covert, MD   650 mg at 01/27/19 1206  . ALPRAZolam Duanne Moron) tablet 1 mg  1 mg Oral TID PRN Crue Otero, Myer Peer, MD      . folic acid (FOLVITE) tablet 1 mg  1 mg Oral Daily Sharma Covert, MD   1 mg at 01/30/19 0753  . [START ON 01/31/2019] lurasidone (LATUDA) tablet 20 mg  20 mg Oral Q breakfast Chanika Byland A, MD      . metoprolol tartrate (LOPRESSOR) tablet 12.5 mg  12.5 mg Oral BID Sharma Covert, MD   12.5 mg at 01/30/19 0753  . nicotine (NICODERM CQ - dosed in mg/24 hours) patch 21 mg  21 mg Transdermal Daily Sharma Covert, MD   21 mg at 01/30/19 0753  . nitroGLYCERIN (NITROSTAT) SL tablet 0.4 mg  0.4 mg Sublingual Q5 min PRN Lindell Spar I, NP   0.4 mg at 01/27/19 1202  . pneumococcal 23 valent vaccine (PNU-IMMUNE) injection 0.5 mL  0.5 mL Intramuscular Tomorrow-1000 Sharma Covert, MD      . rOPINIRole (REQUIP) tablet 3 mg  3 mg Oral QHS Sharma Covert, MD   3 mg at 01/29/19 2232  . sertraline  (ZOLOFT) tablet 25 mg  25 mg Oral Daily Kalif Kattner A, MD      . thiamine (VITAMIN B-1) tablet 100 mg  100 mg Oral Daily Sharma Covert, MD   100 mg at 01/30/19 0753  . traZODone (DESYREL) tablet 50 mg  50 mg Oral QHS PRN Sharma Covert, MD   50 mg at 01/29/19 2231    Lab Results: No results found for this or any previous visit (from the past 76 hour(s)).  Blood Alcohol level:  Lab Results  Component Value Date   ETH <10 01/23/2019   ETH <  5 50/35/4656    Metabolic Disorder Labs: Lab Results  Component Value Date   HGBA1C 5.5 01/23/2019   MPG 111.15 01/23/2019   MPG 100 07/19/2017   No results found for: PROLACTIN Lab Results  Component Value Date   CHOL 196 01/23/2019   TRIG 186 (H) 01/23/2019   HDL 36 (L) 01/23/2019   CHOLHDL 5.4 01/23/2019   VLDL 37 01/23/2019   LDLCALC 123 (H) 01/23/2019   LDLCALC 99 07/19/2017    Physical Findings: AIMS: Facial and Oral Movements Muscles of Facial Expression: None, normal Lips and Perioral Area: None, normal Jaw: None, normal Tongue: None, normal,Extremity Movements Upper (arms, wrists, hands, fingers): None, normal Lower (legs, knees, ankles, toes): None, normal, Trunk Movements Neck, shoulders, hips: None, normal, Overall Severity Severity of abnormal movements (highest score from questions above): None, normal Incapacitation due to abnormal movements: None, normal Patient's awareness of abnormal movements (rate only patient's report): No Awareness, Dental Status Current problems with teeth and/or dentures?: No Does patient usually wear dentures?: No  CIWA:  CIWA-Ar Total: 0 COWS:  COWS Total Score: 2  Musculoskeletal: Strength & Muscle Tone: within normal limits Gait & Station: normal Patient leans: N/A  Psychiatric Specialty Exam: Physical Exam  Nursing note and vitals reviewed. Constitutional: She is oriented to person, place, and time. She appears well-developed and well-nourished.  Cardiovascular:  Normal rate.  Respiratory: Effort normal.  Neurological: She is alert and oriented to person, place, and time.    Review of Systems  Constitutional: Negative.   Respiratory: Sputum production: ETOH.   Psychiatric/Behavioral: Positive for depression and substance abuse. Negative for hallucinations, memory loss and suicidal ideas. The patient is not nervous/anxious and does not have insomnia.     Blood pressure 118/82, pulse 94, temperature 97.6 F (36.4 C), temperature source Oral, resp. rate 18, height 5' 5" (1.651 m), weight 69.9 kg, SpO2 99 %.Body mass index is 25.63 kg/m.  General Appearance: Improving grooming  Eye Contact:  Good  Speech:  Normal Rate  Volume:  Normal  Mood:  Presents vaguely dysphoric/irritable/depressed  Affect:  Congruent and Mildly irritable  Thought Process:  Linear and Descriptions of Associations: Intact  Orientation:  Other:  Fully alert and attentive  Thought Content:  No current hallucinations, does not appear internally preoccupied, no delusions expressed  Suicidal Thoughts:  No denies suicidal or self-injurious ideations, contracts for safety on unit  Homicidal Thoughts:  No  Memory:  Recent and remote grossly intact  Judgement:  Fair/improving  Insight:  Fair  Psychomotor Activity:  Normal-no psychomotor agitation or restlessness  Concentration:  Concentration: Good and Attention Span: Good  Recall:  Good  Fund of Knowledge:  Good  Language:  Good  Akathisia:  No  Handed:  Right  AIMS (if indicated):     Assets:  Communication Skills Desire for Improvement Housing Resilience Social Support  ADL's:  Intact  Cognition:  WNL  Sleep:  Number of Hours: 6   Assessment- 53 year old female, presented to hospital voluntarily on 1/29.  Reported episodes of "blackouts" and visual hallucinations, described as seeing a little man intermittently.  Patient reports these experiences have been relatively recent. She has a history of depression and of  anxiety, follows up with Dr. Toy Care for outpatient psychiatric management.  Home medications included Xanax at 1 mg 4 times daily, phentermine, Requip.  Patient states she has been on Xanax for many years, denies abusing or misusing.  She acknowledges recent episode of alcohol consumption, but denies any pattern of  alcohol abuse or dependence.  Patient endorses persistent dysphoria/irritability/brief mood swings/subjective sense of racing thoughts.  She describes a history of depression but also brief episodes of mood instability/increased irritability/impulsivity.  At this time she has had no further blackouts or amnestic episodes and has had no further visual hallucinations/does not endorse or present with psychotic symptoms. She reports a history of good response/ tolerance to [place] trial in the past.  We have discussed medication options and is interested in this medication trial.  We have again reviewed potential side effects/risks associated with benzodiazepine use to include possible contribution to memory/amnesia, particularly if in combination with alcohol.   Treatment Plan Summary: Daily contact with patient to assess and evaluate symptoms and progress in treatment and Medication management  Treatment plan reviewed as below today 2/5 Continue Zoloft 25 mg daily for depression/anxiety Increase Xanax 1 mg  3 times daily for anxiety Discontinue Propranolol PRNs  Discontinue Seroquel Start Latuda 20 mg daily initially Continue Requip 3 mg  nightly for restless leg syndrome. Continue metoprolol 12.5 mg twice daily  for hypertension and tachycardia Treatment team working on disposition planning options   Jenne Campus, MD 01/30/2019, 10:46 AM   Patient ID: Jilda Panda, female   DOB: [birth year], 53 y.o.   MRN: [id number]

## 2019-01-30 NOTE — Therapy (Signed)
Occupational Therapy Group Note  Date:  01/30/2019 Time:  11:41 AM  Group Topic/Focus:  Self Esteem Action Plan:   The focus of this group is to help patients create a plan to continue to build self-esteem after discharge.  Participation Level:  Minimal  Participation Quality:  Attentive  Affect:  Flat  Cognitive:  Appropriate  Insight: Improving  Engagement in Group:  Limited  Modes of Intervention:  Activity, Discussion, Education and Socialization  Additional Comments:    S: "I get increased self esteem from my boo"  O: OT tx with focus on self esteem building this date. Education given on definition of self esteem, with both causes of low and high self esteem identified. Activity given for pt to identify a positive/aspiring trait for each letter of the alphabet. Pt to work with peers to help complete activity and build positive thinking.   A: Pt presents to group with flat affect, initially presenting very fatigued and unwilling to participate. Pt curled up in chair with eyes closed, when asked if okay pt states "I am ill and that doctor is supposed to see me first". Pt left to meet with provider, upon returning pt very engaged and completing A-Z activity with no VC's.  P: Education given on self esteem and how to improve this date. Handouts and activities given to help facilitate skills when reintegrating into community.   Dalphine Handing, MSOT, OTR/L Behavioral Health OT/ Acute Relief OT PHP Office: [phone]  Dalphine Handing 01/30/2019, 11:41 AM

## 2019-01-30 NOTE — Tx Team (Signed)
Interdisciplinary Treatment and Diagnostic Plan Update  01/30/2019 Time of Session:  Tora PerchesBeverly A Rezabek MRN: [id number]  Principal Diagnosis: <principal problem not specified>  Secondary Diagnoses: Active Problems:   MDD (major depressive disorder), recurrent, severe, with psychosis (HCC)   Current Medications:  Current Facility-Administered Medications  Medication Dose Route Frequency Provider Last Rate Last Dose  . acetaminophen (TYLENOL) tablet 650 mg  650 mg Oral Q6H PRN Antonieta Pertlary, Greg Lawson, MD   650 mg at 01/27/19 1206  . ALPRAZolam Prudy Feeler(XANAX) tablet 1 mg  1 mg Oral TID PRN Cobos, Rockey SituFernando A, MD      . folic acid (FOLVITE) tablet 1 mg  1 mg Oral Daily Antonieta Pertlary, Greg Lawson, MD   1 mg at 01/30/19 0753  . [START ON 01/31/2019] lurasidone (LATUDA) tablet 20 mg  20 mg Oral Q breakfast Cobos, Fernando A, MD      . metoprolol tartrate (LOPRESSOR) tablet 12.5 mg  12.5 mg Oral BID Antonieta Pertlary, Greg Lawson, MD   12.5 mg at 01/30/19 0753  . nicotine (NICODERM CQ - dosed in mg/24 hours) patch 21 mg  21 mg Transdermal Daily Antonieta Pertlary, Greg Lawson, MD   21 mg at 01/30/19 0753  . nitroGLYCERIN (NITROSTAT) SL tablet 0.4 mg  0.4 mg Sublingual Q5 min PRN Armandina StammerNwoko, Agnes I, NP   0.4 mg at 01/27/19 1202  . pneumococcal 23 valent vaccine (PNU-IMMUNE) injection 0.5 mL  0.5 mL Intramuscular Tomorrow-1000 Antonieta Pertlary, Greg Lawson, MD      . rOPINIRole (REQUIP) tablet 3 mg  3 mg Oral QHS Antonieta Pertlary, Greg Lawson, MD   3 mg at 01/29/19 2232  . sertraline (ZOLOFT) tablet 25 mg  25 mg Oral Daily Cobos, Fernando A, MD      . thiamine (VITAMIN B-1) tablet 100 mg  100 mg Oral Daily Antonieta Pertlary, Greg Lawson, MD   100 mg at 01/30/19 0753  . traZODone (DESYREL) tablet 50 mg  50 mg Oral QHS PRN Antonieta Pertlary, Greg Lawson, MD   50 mg at 01/29/19 2231   PTA Medications: Medications Prior to Admission  Medication Sig Dispense Refill Last Dose  . phentermine 37.5 MG capsule Take 37.5 mg by mouth every morning.     Marland Kitchen. rOPINIRole (REQUIP) 3 MG tablet Take 3 mg by mouth  at bedtime.   0 Taking  . [DISCONTINUED] ALPRAZolam (XANAX) 1 MG tablet Take 1 mg by mouth 4 (four) times daily as needed for anxiety.    Taking  . nicotine (NICODERM CQ - DOSED IN MG/24 HOURS) 21 mg/24hr patch Place 1 patch (21 mg total) onto the skin daily. (Patient not taking: Reported on 01/24/2019) 28 patch 0 Not Taking at Unknown time    Patient Stressors: Health problems Marital or family conflict Other: psychosis, blackouts  Patient Strengths: Wellsite geologistCommunication skills General fund of knowledge Supportive family/friends  Treatment Modalities: Medication Management, Group therapy, Case management,  1 to 1 session with clinician, Psychoeducation, Recreational therapy.   Physician Treatment Plan for Primary Diagnosis: <principal problem not specified> Long Term Goal(s): Improvement in symptoms so as ready for discharge Improvement in symptoms so as ready for discharge   Short Term Goals: Ability to identify changes in lifestyle to reduce recurrence of condition will improve Ability to verbalize feelings will improve Ability to disclose and discuss suicidal ideas Ability to demonstrate self-control will improve Ability to identify and develop effective coping behaviors will improve Ability to maintain clinical measurements within normal limits will improve Compliance with prescribed medications will improve Ability to identify triggers associated with  substance abuse/mental health issues will improve Ability to identify changes in lifestyle to reduce recurrence of condition will improve Ability to verbalize feelings will improve Ability to disclose and discuss suicidal ideas Ability to demonstrate self-control will improve Ability to identify and develop effective coping behaviors will improve Ability to maintain clinical measurements within normal limits will improve Compliance with prescribed medications will improve Ability to identify triggers associated with substance  abuse/mental health issues will improve  Medication Management: Evaluate patient's response, side effects, and tolerance of medication regimen.  Therapeutic Interventions: 1 to 1 sessions, Unit Group sessions and Medication administration.  Evaluation of Outcomes: Adequate for Discharge  Physician Treatment Plan for Secondary Diagnosis: Active Problems:   MDD (major depressive disorder), recurrent, severe, with psychosis (HCC)  Long Term Goal(s): Improvement in symptoms so as ready for discharge Improvement in symptoms so as ready for discharge   Short Term Goals: Ability to identify changes in lifestyle to reduce recurrence of condition will improve Ability to verbalize feelings will improve Ability to disclose and discuss suicidal ideas Ability to demonstrate self-control will improve Ability to identify and develop effective coping behaviors will improve Ability to maintain clinical measurements within normal limits will improve Compliance with prescribed medications will improve Ability to identify triggers associated with substance abuse/mental health issues will improve Ability to identify changes in lifestyle to reduce recurrence of condition will improve Ability to verbalize feelings will improve Ability to disclose and discuss suicidal ideas Ability to demonstrate self-control will improve Ability to identify and develop effective coping behaviors will improve Ability to maintain clinical measurements within normal limits will improve Compliance with prescribed medications will improve Ability to identify triggers associated with substance abuse/mental health issues will improve     Medication Management: Evaluate patient's response, side effects, and tolerance of medication regimen.  Therapeutic Interventions: 1 to 1 sessions, Unit Group sessions and Medication administration.  Evaluation of Outcomes: Adequate for Discharge   RN Treatment Plan for Primary Diagnosis:  <principal problem not specified> Long Term Goal(s): Knowledge of disease and therapeutic regimen to maintain health will improve  Short Term Goals: Ability to participate in decision making will improve, Ability to verbalize feelings will improve, Ability to disclose and discuss suicidal ideas, Ability to identify and develop effective coping behaviors will improve and Compliance with prescribed medications will improve  Medication Management: RN will administer medications as ordered by provider, will assess and evaluate patient's response and provide education to patient for prescribed medication. RN will report any adverse and/or side effects to prescribing provider.  Therapeutic Interventions: 1 on 1 counseling sessions, Psychoeducation, Medication administration, Evaluate responses to treatment, Monitor vital signs and CBGs as ordered, Perform/monitor CIWA, COWS, AIMS and Fall Risk screenings as ordered, Perform wound care treatments as ordered.  Evaluation of Outcomes: Adequate for Discharge   LCSW Treatment Plan for Primary Diagnosis: <principal problem not specified> Long Term Goal(s): Safe transition to appropriate next level of care at discharge, Engage patient in therapeutic group addressing interpersonal concerns.  Short Term Goals: Engage patient in aftercare planning with referrals and resources  Therapeutic Interventions: Assess for all discharge needs, 1 to 1 time with Social worker, Explore available resources and support systems, Assess for adequacy in community support network, Educate family and significant other(s) on suicide prevention, Complete Psychosocial Assessment, Interpersonal group therapy.  Evaluation of Outcomes: Adequate for Discharge   Progress in Treatment: Attending groups: Yes. Participating in groups: Yes. Taking medication as prescribed: Yes. Toleration medication: Yes. Family/Significant other contact made:  Yes, individual(s) contacted:  with the  patient's husband Patient understands diagnosis: Yes. Discussing patient identified problems/goals with staff: Yes. Medical problems stabilized or resolved: Yes. Denies suicidal/homicidal ideation: Yes. Issues/concerns per patient self-inventory: No. Other:   New problem(s) identified:None   New Short Term/Long Term Goal(s): medication stabilization, elimination of SI thoughts, development of comprehensive mental wellness plan.   Patient Goals:  I want to feel better and I want to have a life again  Discharge Plan or Barriers: Patient plans to discharge home with her husband. She reports she will follow up with [facility] for therapy services. She also reports she will follow up with [name] for medication management services.   Reason for Continuation of Hospitalization: Depression Hallucinations Medication stabilization Suicidal ideation Withdrawal symptoms  Estimated Length of Stay: 01/31/2019  Attendees: Patient: 01/30/2019 10:47 AM  Physician: Dr. Landry Mellow, MD; Dr. Nehemiah Massed, MD 01/30/2019 10:47 AM  Nursing: Huntley Dec.Elbert Ewings, RN; Casimiro Needle.Kathie Rhodes, RN 01/30/2019 10:47 AM  RN Care Manager: Onnie Boer, RN 01/30/2019 10:47 AM  Social Worker: Baldo Daub, LCSWA 01/30/2019 10:47 AM  Recreational Therapist:  01/30/2019 10:47 AM  Other: Marciano Sequin, NP 01/30/2019 10:47 AM  Other:  01/30/2019 10:47 AM  Other: 01/30/2019 10:47 AM    Scribe for Treatment Team: Maeola Sarah, LCSWA 01/30/2019 10:47 AM

## 2019-01-31 MED ORDER — SERTRALINE HCL 50 MG PO TABS
50.0000 mg | ORAL_TABLET | Freq: Every day | ORAL | Status: DC
  Administered 2019-02-01: 50 mg via ORAL
  Filled 2019-01-31 (×2): qty 1

## 2019-01-31 MED ORDER — QUETIAPINE FUMARATE 50 MG PO TABS
150.0000 mg | ORAL_TABLET | Freq: Every day | ORAL | Status: DC
  Administered 2019-01-31: 150 mg via ORAL
  Filled 2019-01-31 (×2): qty 3

## 2019-01-31 NOTE — Progress Notes (Signed)
Pt attend wrap up group. Pt said several times this is a waste of my time .This place is not helping me. I do not want to be here, My hand swelling. " I do not know why? The pt Timuira agreed with everything Atzin had to say. Timuira said oh my feet is swelling and I can not walk on it. I am not going to tell the nurse she do not care. There is no need to tell anyone here they do not care what happens to me."I am not taking no more freaking medicine here". Pt asked the other pt's in the day room how was their visit with the nurse and the doctor?

## 2019-01-31 NOTE — Progress Notes (Signed)
Patient and her husband have discussed latuda 20 mg.  Patient did not want to take latuda this morning until she talked to MD in more detail.  Stated she has started taking zoloft while at [facility].

## 2019-01-31 NOTE — [facility] Group Notes (Signed)
[facility] [employer] Group Therapy      01/31/2019 3:20 PM  Type of Therapy: [facility] [employer] Presentation  Participation Level: Active  Participation Quality: Attentive  Affect: Appropriate  Cognitive: Oriented  Insight: Developing/Improving  Engagement in Therapy: Engaged  Modes of Intervention: Discussion, Education and Socialization  Summary of Progress/Problems: Mental [employer] Association (MHA) Speaker came to talk about his personal journey with mental health. The pt processed ways by which to relate to the speaker. MHA speaker provided handouts and educational information pertaining to groups and services offered by the MHA. Pt was engaged in speaker's presentation and was receptive to resources provided.    Viviane Semidey LCSWA Clinical Social Worker   

## 2019-01-31 NOTE — Progress Notes (Signed)
D:  Quetzali was up and visible on the unit.  She was noted interacting well with peers, laughing and joking in the day room.  She reported that her day wasn't good and that she was having swelling in her hands.  Some slight swelling noted and she stated that she has this from time to time.  She continues to report ongoing anxiety and requested her xanax with her hs medications.  She became upset because it was too early, "I am supposed to take them 4 times per day."  Explained to her that the order was changed to tid prn.  She then refused to take her hs medications until it was time to take her xanax at 1204am.  She denied SI but when asked about HI, she stated "yes I want to kill somebody, my daughter in law."  RN attempted to process this with her and after talking she stated, "I just want to choke her but not really, I don't really want to do that because I want to be there for my son."  She denied A/V hallucinations.  She did later come back to take her hs medications.  She is currently resting with her eyes closed and appears to be asleep. A:  1:1 with RN for support and encouragement.  Medications as ordered.  Q 15 minute checks maintained for safety.  Encouraged participation in group and unit activities.   R:  Dijonae remains safe on the unit.  We will continue to monitor the progress towards her goals.

## 2019-01-31 NOTE — Progress Notes (Signed)
D:  Joanna Simpson was up and visible in the day room.  She was interacting well with staff and peers.  She reported that she had a great day, rating it a 7/10 (10 the best).  She feels like she is ready to go home and her husband agreed with her during visitation.  She denied SI/HI or A/V hallucinations.  She continues to verbalize continued anxiety and xanax was given prn along with her hs medications.  She is currently resting with her eyes closed and appears to be asleep. A:  1:1 with RN for support and encouragement.  Medications as ordered.  Q 15 minute checks maintained for safety.  Encouraged participation in group and unit activities.   R:  Jenavie remains safe on the unit.  We will continue to monitor the progress towards her goals.

## 2019-01-31 NOTE — Plan of Care (Signed)
Nurse discussed anxiety, depression and coping skills with patient.  

## 2019-01-31 NOTE — Progress Notes (Signed)
D:  Patient's self inventory sheet, patient has poor sleep, sleep medication helpful.  Fair appetite.  Rated depression 3, denied hopeless and anxiety 8.  Denied withdrawals.  Denied SI.  Denied physical problems.  Denied physical pain.  Goal is try to get the right medication and the right help for my depression.  Want to concentrate on her disorder of depression.  Not sleeping at night.  No discharge plans. A:  Medications administered per MD orders.  Emotional support and encouragement given patient. R:  Denied SI and HI, contracts for safety.  Denied A/V hallucinations.  Safety maintained with 15 minute checks.

## 2019-01-31 NOTE — Progress Notes (Signed)
   01/31/19 0500  Sleep  Number of Hours 4.5

## 2019-01-31 NOTE — Progress Notes (Signed)
[facility] MD Progress Note  01/31/2019 10:57 AM NASHLA ALTHOFF  MRN:  [id number] Subjective: Patient reports lingering depression, anxiety, states "I do not think I am ready to discharge ". Also states that she spoke with her husband and daughter about medication options, and that she does not want to continue [place].  States " I do not think I was on it before, and he told me he can have bad side effects" Prefers to continue Seroquel, which she has been tolerating well.  Today denies suicidal ideations.  Objective: I have reviewed case with treatment team and have met with patient. 53 year old female, presented to hospital voluntarily on 1/29.  Reported episodes of "blackouts" and visual hallucinations, described as seeing a little man intermittently.  She has a history of depression and of anxiety, follows up with Dr. Toy Care for outpatient psychiatric management.  Home medications included Xanax at 1 mg 4 times daily, phentermine, Requip.   Patient presents vaguely anxious, depressed.  Denies current suicidal ideations and contracts for safety on unit.  Does not endorse hallucinations nor does she appear internally preoccupied.  States she last experienced visual hallucination about 3 days ago.  She has had no amnestic or "blackout" periods since admission and presents fully alert and attentive.  She remains ruminative, and today focused on medication concerns.  States she spoke with her family and was told that she had not been on [place] before and is concerned about Latuda related side effects. She has been on Seroquel recently and would prefer to resume Seroquel which was not associated with side effects. Patient presents less irritable, although does become angry/irritated when discussing medication issues, particularly regarding alprazolam.  I have reviewed potential risks associated with alprazolam to include abuse potential, amnestic properties, cognitive side effects.  She has been on Xanax for  years, for management of anxiety, and states that she does not feel it is realistic for her to detox or taper off at this time.  We have also reviewed potential side effects associated with phentermine, which she was taking prior to admission, including potential for psychotic side effects.  She is tolerating Zoloft trial well and agrees to further/gradual titration. No disruptive or agitated behaviors on unit, visible in dayroom.      Principal Problem:  MDD Diagnosis: Active Problems:   MDD (major depressive disorder), recurrent, severe, with psychosis ([place])  Total Time spent with patient: 20 minutes  Past Psychiatric History: See admission H&P  Past Medical History:  Past Medical History:  Diagnosis Date  . Anxiety   . Hypertension   . Restless leg syndrome   . Tachycardia     Past Surgical History:  Procedure Laterality Date  . CHOLECYSTECTOMY    . ENDOMETRIAL ABLATION    . TUBAL LIGATION     Family History:  Family History  Problem Relation Age of Onset  . Diabetes Mother   . Hypertension Mother   . Cancer Mother        pancreatic  . Diabetes Father   . Hypertension Father   . Cancer Father        colon  . Diabetes Sister    Family Psychiatric  History: See admission H&P Social History:  Social History   Substance and Sexual Activity  Alcohol Use Yes   Comment: Pt reported recently drinking up to 1/5 of liquor per day     Social History   Substance and Sexual Activity  Drug Use No    Social History  Socioeconomic History  . Marital status: Married    Spouse name: Lanny Hurst  . Number of children: Not on file  . Years of education: Not on file  . Highest education level: Not on file  Occupational History  . Occupation: Unemployed  Social Needs  . Financial resource strain: Not on file  . Food insecurity:    Worry: Not on file    Inability: Not on file  . Transportation needs:    Medical: Not on file    Non-medical: Not on file  Tobacco Use  .  Smoking status: Current Every Day Smoker    Packs/day: 0.50    Types: Cigarettes  . Smokeless tobacco: Never Used  Substance and Sexual Activity  . Alcohol use: Yes    Comment: Pt reported recently drinking up to 1/5 of liquor per day  . Drug use: No  . Sexual activity: Yes    Birth control/protection: Surgical  Lifestyle  . Physical activity:    Days per week: Not on file    Minutes per session: Not on file  . Stress: Not on file  Relationships  . Social connections:    Talks on phone: Not on file    Gets together: Not on file    Attends religious service: Not on file    Active member of club or organization: Not on file    Attends meetings of clubs or organizations: Not on file    Relationship status: Not on file  Other Topics Concern  . Not on file  Social History Narrative   Pt lives with husband Lanny Hurst in [place]; unemployed.  Followed by Chucky May.   Additional Social History:    Pain Medications: See MAR Prescriptions: See MAR Over the Counter: See MAR History of alcohol / drug use?: Yes Name of Substance 1: Alcohol 1 - Amount (size/oz): up to 1/5 of liquor 1 - Frequency: Episodic 1 - Duration: several months 1 - Last Use / Amount: 01/19/2019  Sleep: Fair  Appetite:  Good  Current Medications: Current Facility-Administered Medications  Medication Dose Route Frequency Provider Last Rate Last Dose  . acetaminophen (TYLENOL) tablet 650 mg  650 mg Oral Q6H PRN Sharma Covert, MD   650 mg at 01/31/19 0956  . ALPRAZolam Duanne Moron) tablet 1 mg  1 mg Oral TID PRN Anmol Paschen, Myer Peer, MD   1 mg at 01/31/19 0956  . folic acid (FOLVITE) tablet 1 mg  1 mg Oral Daily Sharma Covert, MD   1 mg at 01/31/19 1914  . metoprolol tartrate (LOPRESSOR) tablet 12.5 mg  12.5 mg Oral BID Sharma Covert, MD   12.5 mg at 01/31/19 0730  . nicotine (NICODERM CQ - dosed in mg/24 hours) patch 21 mg  21 mg Transdermal Daily Sharma Covert, MD   21 mg at 01/31/19 0731  .  nitroGLYCERIN (NITROSTAT) SL tablet 0.4 mg  0.4 mg Sublingual Q5 min PRN Lindell Spar I, NP   0.4 mg at 01/27/19 1202  . pneumococcal 23 valent vaccine (PNU-IMMUNE) injection 0.5 mL  0.5 mL Intramuscular Tomorrow-1000 Sharma Covert, MD      . QUEtiapine (SEROQUEL) tablet 150 mg  150 mg Oral QHS Tamula Morrical A, MD      . rOPINIRole (REQUIP) tablet 3 mg  3 mg Oral QHS Sharma Covert, MD   3 mg at 01/30/19 2231  . [START ON 02/01/2019] sertraline (ZOLOFT) tablet 50 mg  50 mg Oral Daily Greer Wainright, Myer Peer, MD      .  thiamine (VITAMIN B-1) tablet 100 mg  100 mg Oral Daily Sharma Covert, MD   100 mg at 01/31/19 0732  . traZODone (DESYREL) tablet 50 mg  50 mg Oral QHS PRN Sharma Covert, MD   50 mg at 01/30/19 2231    Lab Results: No results found for this or any previous visit (from the past 31 hour(s)).  Blood Alcohol level:  Lab Results  Component Value Date   ETH <10 01/23/2019   ETH <5 32/35/5732    Metabolic Disorder Labs: Lab Results  Component Value Date   HGBA1C 5.5 01/23/2019   MPG 111.15 01/23/2019   MPG 100 07/19/2017   No results found for: PROLACTIN Lab Results  Component Value Date   CHOL 196 01/23/2019   TRIG 186 (H) 01/23/2019   HDL 36 (L) 01/23/2019   CHOLHDL 5.4 01/23/2019   VLDL 37 01/23/2019   LDLCALC 123 (H) 01/23/2019   LDLCALC 99 07/19/2017    Physical Findings: AIMS: Facial and Oral Movements Muscles of Facial Expression: None, normal Lips and Perioral Area: None, normal Jaw: None, normal Tongue: None, normal,Extremity Movements Upper (arms, wrists, hands, fingers): None, normal Lower (legs, knees, ankles, toes): None, normal, Trunk Movements Neck, shoulders, hips: None, normal, Overall Severity Severity of abnormal movements (highest score from questions above): None, normal Incapacitation due to abnormal movements: None, normal Patient's awareness of abnormal movements (rate only patient's report): No Awareness, Dental  Status Current problems with teeth and/or dentures?: No Does patient usually wear dentures?: No  CIWA:  CIWA-Ar Total: 2 COWS:  COWS Total Score: 3  Musculoskeletal: Strength & Muscle Tone: within normal limits Gait & Station: normal Patient leans: N/A  Psychiatric Specialty Exam: Physical Exam  Nursing note and vitals reviewed. Constitutional: She is oriented to person, place, and time. She appears well-developed and well-nourished.  Cardiovascular: Normal rate.  Respiratory: Effort normal.  Neurological: She is alert and oriented to person, place, and time.    Review of Systems  Constitutional: Negative.   Respiratory: Sputum production: ETOH.   Psychiatric/Behavioral: Positive for depression and substance abuse. Negative for hallucinations, memory loss and suicidal ideas. The patient is not nervous/anxious and does not have insomnia.   no chest pain, no dyspnea at room air, no vomiting   Blood pressure 124/85, pulse 93, temperature 97.6 F (36.4 C), temperature source Oral, resp. rate 18, height '5\' 5"'  (1.651 m), weight 69.9 kg, SpO2 99 %.Body mass index is 25.63 kg/m.  General Appearance: Improving grooming  Eye Contact:  Good  Speech:  Normal Rate  Volume:  Normal  Mood:  Reports still feeling depressed  Affect:  Constricted, slightly irritable at times, affect tends to improve partially during session, smiles at times appropriately during session  Thought Process:  Linear and Descriptions of Associations: Intact  Orientation:  Other:  Fully alert and attentive  Thought Content:  No current hallucinations, does not appear internally preoccupied, no delusions expressed  Suicidal Thoughts:  No denies suicidal or self-injurious ideations, contracts for safety on unit  Homicidal Thoughts:  No  Memory:  Recent and remote grossly intact  Judgement:  Fair/improving  Insight:  Fair  Psychomotor Activity:  Normal-no psychomotor agitation or restlessness  Concentration:   Concentration: Good and Attention Span: Good  Recall:  Good  Fund of Knowledge:  Good  Language:  Good  Akathisia:  No  Handed:  Right  AIMS (if indicated):     Assets:  Communication Skills Desire for [place]  ADL's:  Intact  Cognition:  WNL  Sleep:  Number of Hours: 4.5   Assessment- 53 year old female, presented to hospital voluntarily on 1/29.  Reported episodes of "blackouts" and visual hallucinations, described as seeing a little man intermittently.  Patient reports these experiences have been relatively recent. She has a history of depression and of anxiety, follows up with Dr. Toy Care for outpatient psychiatric management.  Home medications included Xanax at 1 mg 4 times daily, phentermine, Requip.  Patient states she has been on Xanax for many years, denies abusing or misusing.  She acknowledges recent episode of alcohol consumption, but denies any pattern of alcohol abuse or dependence.  Patient describes persistent anxiety, depression, insomnia.  Does not endorse suicidal ideations and presents future oriented.  Contracts for safety on unit.  No current withdrawal symptoms noted and vitals are stable.  She is no longer experiencing visual hallucinations and no delusions or thought disorder are noted.  Of note, today expresses concern about Latuda trial and expresses wish to continue Seroquel management rather than switching to [place].   Treatment Plan Summary: Daily contact with patient to assess and evaluate symptoms and progress in treatment and Medication management  Treatment plan reviewed as below today 2/6 Increase Zoloft to 50  mg daily for depression/anxiety Increase Xanax 1 mg  3 times daily for anxiety D/C Latuda Restart Seroquel at 150 mgrs QHS for mood disorder, psychosis Continue Requip 3 mg  nightly for restless leg syndrome. Continue Metoprolol 12.5 mg twice daily  for hypertension and tachycardia Treatment team working on  disposition planning options   Jenne Campus, MD 01/31/2019, 10:57 AM   Patient ID: Jilda Panda, female   DOB: Feb 15, 1966, 53 y.o.   MRN: [id number]

## 2019-02-01 MED ORDER — QUETIAPINE FUMARATE 50 MG PO TABS: 150.0000 mg | ORAL_TABLET | Freq: Every day | ORAL | 0 refills | Status: AC

## 2019-02-01 MED ORDER — TRAZODONE HCL 50 MG PO TABS: 50.0000 mg | ORAL_TABLET | Freq: Every evening | ORAL | 0 refills | Status: AC | PRN

## 2019-02-01 MED ORDER — METOPROLOL TARTRATE 25 MG PO TABS: 12.5000 mg | ORAL_TABLET | Freq: Two times a day (BID) | ORAL | 0 refills | Status: AC

## 2019-02-01 MED ORDER — SERTRALINE HCL 50 MG PO TABS: 50.0000 mg | ORAL_TABLET | Freq: Every day | ORAL | 0 refills | Status: AC

## 2019-02-01 MED ORDER — ALPRAZOLAM 1 MG PO TABS: 1.0000 mg | ORAL_TABLET | Freq: Three times a day (TID) | ORAL | 0 refills | Status: AC | PRN

## 2019-02-01 NOTE — Progress Notes (Signed)
  [facility] Adult Case Management Discharge Plan :  Will you be returning to the same living situation after discharge:  Yes,  patient reports that she is returning home with her husband at discharge  At discharge, do you have transportation home?: Yes,  patient reports that her husband is picking her up at discharge Do you have the ability to pay for your medications: Yes,  [employer]  Release of information consent forms completed and in the chart;  Patient's signature needed at discharge.  Patient to Follow up at: Follow-up Information    [facility] Follow up on 02/01/2019.   Why:  Hospital follow up appointment is 2/7 at 9:00a. Please bring your photo ID, proof of insurance, SSN, current medications, and discharge from this hospitalization.  Contact information: [address] [place] [address] [phone]        [name], MD. Go on 02/21/2019.   Specialty:  Psychiatry Why:  Medication Management appointment with [name] on 02/21/19 at 3:30pm. Please bring your current medications and discharge summary from this hospitilization with you. Contact information: [address] P[address]Tyson Bab[address] [place] [address] [phone]           Next level of care provider has access to [facility] Link:yes  Safety Planning and Suicide Prevention discussed: Yes,  with the patient's husband  Have you used any form of tobacco in the last 30 days? (Cigarettes, Smokeless Tobacco, Cigars, and/or Pipes): Yes  Has patient been referred to the Quitline?: Patient refused referral  Patient has been referred for addiction treatment: Pt. refused referral  Maeola Sarah, LCSWA 02/01/2019, 10:29 AM

## 2019-02-01 NOTE — Plan of Care (Signed)
  Problem: Spiritual Needs Goal: Ability to function at adequate level Outcome: Completed/Met   Problem: Education: Goal: Knowledge of [place] Education information/materials will improve Outcome: Completed/Met Goal: Emotional status will improve Outcome: Completed/Met Goal: Mental status will improve Outcome: Completed/Met Goal: Verbalization of understanding the information provided will improve Outcome: Completed/Met   Problem: Activity: Goal: Interest or engagement in activities will improve Outcome: Completed/Met Goal: Sleeping patterns will improve Outcome: Completed/Met   Problem: Coping: Goal: Ability to verbalize frustrations and anger appropriately will improve Outcome: Completed/Met Goal: Ability to demonstrate self-control will improve Outcome: Completed/Met   Problem: Health Behavior/Discharge Planning: Goal: Identification of resources available to assist in meeting health care needs will improve Outcome: Completed/Met Goal: Compliance with treatment plan for underlying cause of condition will improve Outcome: Completed/Met   Problem: Physical Regulation: Goal: Ability to maintain clinical measurements within normal limits will improve Outcome: Completed/Met   Problem: Safety: Goal: Periods of time without injury will increase Outcome: Completed/Met   Problem: Education: Goal: Ability to make informed decisions regarding treatment will improve Outcome: Completed/Met   Problem: Coping: Goal: Coping ability will improve Outcome: Completed/Met   Problem: Health Behavior/Discharge Planning: Goal: Identification of resources available to assist in meeting health care needs will improve Outcome: Completed/Met   Problem: Medication: Goal: Compliance with prescribed medication regimen will improve Outcome: Completed/Met   Problem: Self-Concept: Goal: Ability to disclose and discuss suicidal ideas will improve Outcome: Completed/Met Goal:  Will verbalize positive feelings about self Outcome: Completed/Met   Problem: Education: Goal: Utilization of techniques to improve thought processes will improve Outcome: Completed/Met Goal: Knowledge of the prescribed therapeutic regimen will improve Outcome: Completed/Met   Problem: Activity: Goal: Interest or engagement in leisure activities will improve Outcome: Completed/Met Goal: Imbalance in normal sleep/wake cycle will improve Outcome: Completed/Met   Problem: Coping: Goal: Coping ability will improve Outcome: Completed/Met Goal: Will verbalize feelings Outcome: Completed/Met   Problem: Health Behavior/Discharge Planning: Goal: Ability to make decisions will improve Outcome: Completed/Met Goal: Compliance with therapeutic regimen will improve Outcome: Completed/Met   Problem: Role Relationship: Goal: Will demonstrate positive changes in social behaviors and relationships Outcome: Completed/Met   Problem: Safety: Goal: Ability to disclose and discuss suicidal ideas will improve Outcome: Completed/Met Goal: Ability to identify and utilize support systems that promote safety will improve Outcome: Completed/Met   Problem: Self-Concept: Goal: Will verbalize positive feelings about self Outcome: Completed/Met Goal: Level of anxiety will decrease Outcome: Completed/Met

## 2019-02-01 NOTE — Progress Notes (Signed)
Nursing discharge note: Patient discharged home per MD order.  Patient received all personal belongings from unit and locker.  Reviewed AVS/transition record with patient and she indicates understanding.  Patient will follow up with [name].  Patient denies any thoughts of self harm.  She left ambulatory with her husband.

## 2019-02-01 NOTE — Discharge Summary (Addendum)
Physician Discharge Summary Note  Patient:  Joanna Simpson is an 53 y.o., female MRN:  [id number] DOB:  [birth year] Patient phone:  [phone] (home)  Patient address:   [address] [place] [address],  Total Time spent with patient: 15 minutes  Date of Admission:  01/23/2019 Date of Discharge: 02/01/2019  Reason for Admission: "blackouts" with self-injurious behaviors, hallucinations  Principal Problem: MDD (major depressive disorder), recurrent, severe, with psychosis (HCC) Discharge Diagnoses: Principal Problem:   MDD (major depressive disorder), recurrent, severe, with psychosis (HCC)   Past Psychiatric History: Per admission H&P: Patient is a history of at least one previous psychiatric hospitalization here in 2018.  She has been followed by Dr. Evelene Simpson as her outpatient psychiatrist.  She has regularly had her Xanax 1 mg p.o. 4 times daily written.  She is also been previously treated with phentermine and Requip.  Past Medical History:  Past Medical History:  Diagnosis Date  . Anxiety   . Hypertension   . Restless leg syndrome   . Tachycardia     Past Surgical History:  Procedure Laterality Date  . CHOLECYSTECTOMY    . ENDOMETRIAL ABLATION    . TUBAL LIGATION     Family History:  Family History  Problem Relation Age of Onset  . Diabetes Mother   . Hypertension Mother   . Cancer Mother        pancreatic  . Diabetes Father   . Hypertension Father   . Cancer Father        colon  . Diabetes Sister    Family Psychiatric  History: Per admission H&P: None per patient Social History:  Social History   Substance and Sexual Activity  Alcohol Use Yes   Comment: Pt reported recently drinking up to 1/5 of liquor per day     Social History   Substance and Sexual Activity  Drug Use No    Social History   Socioeconomic History  . Marital status: Married    Spouse name: Joanna Simpson  . Number of children: Not on file  . Years of education: Not on file  . Highest  education level: Not on file  Occupational History  . Occupation: Unemployed  Social Needs  . Financial resource strain: Not on file  . Food insecurity:    Worry: Not on file    Inability: Not on file  . Transportation needs:    Medical: Not on file    Non-medical: Not on file  Tobacco Use  . Smoking status: Current Every Day Smoker    Packs/day: 0.50    Types: Cigarettes  . Smokeless tobacco: Never Used  Substance and Sexual Activity  . Alcohol use: Yes    Comment: Pt reported recently drinking up to 1/5 of liquor per day  . Drug use: No  . Sexual activity: Yes    Birth control/protection: Surgical  Lifestyle  . Physical activity:    Days per week: Not on file    Minutes per session: Not on file  . Stress: Not on file  Relationships  . Social connections:    Talks on phone: Not on file    Gets together: Not on file    Attends religious service: Not on file    Active member of club or organization: Not on file    Attends meetings of clubs or organizations: Not on file    Relationship status: Not on file  Other Topics Concern  . Not on file  Social History Narrative  Pt lives with husband Joanna Simpson in [place]; unemployed.  Followed by Joanna Simpson.    Hospital Course:  From admission H&P 01/23/2019: Patient is a 53 year old female with a past psychiatric history reportedly for depression, anxiety, probable alcohol dependence and Xanax dependence who presented as a walk-in to the behavioral health hospital on 01/23/2019. The patient presented with her husband because of recent "blackouts" as well as what was reported to self-injurious behavior. She also admitted to hallucinations. She was accompanied by her husband. The patient has a previous psychiatric admission to our facility in 2018 after an intentional overdose. She is followed by Dr. Nelma Simpson. She is prescribed Xanax 1 mg p.o. 4 times daily and has basically received 120 tablets on a regular basis for an  extended period of time. The patient also reported having had either dissociative episodes or amnestic episodes where she could not remember what occurred over a period of time. She also had self-injurious behaviors during that time. During the blackout. She would scratch her eyes, scratch her chest, put her head under water in the bathtub. She had no memory of these events. She admitted to helplessness, hopelessness and worthlessness as well as insomnia. She stated that she needed something "strong" because she does not sleep. She stated her visual hallucinations was of a little man who urged her to harm herself. Her current list of medications included Xanax, phentermine, and Requip. She denied any recent falls, but there was concern that this could be delirium from alcohol and/or Xanax, the potential for liver related issues with increased ammonia, and as well the possibility of previous falls leading to head injury. Because of the possibility of multiple medical problems that could be the origin of this it was decided to obtain a CT scan of the head acutely as well as send her to the hospital to get her lab work done including ammonia etc.   Ms. Joanna Simpson was admitted for hallucinations and blackout episodes with self-injurious behaviors. Collateral information was obtained from her husband, who reported personality changes two months ago, with patient recently moving away to [facility] by herself and not speaking to him or other family members. Mr. Joanna Simpson reported patient had started speaking/seeing him again in the last 10 days prior to admission, and he witnessed 3 of her blackout episodes, one of which involved patient busting her head through a window. CT of head was negative 01/23/2019. He also witnessed her drinking a fifth of vodka in the last week; he reported she does not typically drink. Ms. Joanna Simpson was also having AVH of a "little man" that she thought was an evil spirit, who told her to  hurt herself.  Ms. Joanna Simpson was unremarkable, UDS positive for BZDs only. BAL<10. She denied drug use and was unable to say how much she had been drinking recently. Seroquel was started for hallucinations/mood instability/insomnia. Patient was irritable and focused on Xanax during hospitalization, asking nurses for dose prior to scheduled time (was continued at 1 mg QID at beginning of hospitalization). She was resistant to tapering off Xanax. Xanax was decreased to 1 mg TID PRN due to concerns for dependence and ETOH use. Zoloft was started for depression. PRN trazodone was started for insomnia. Ms. Brazda remained on the [facility] unit for 10 days. She stabilized with medication and therapy. She was discharged on the medications listed below. She has shown improvement with improved mood, affect, sleep, appetite, and interaction. She denies any SI/HI/AVH and contracts for safety. She states she will be  returning home to live with her husband. [facility] with Dr. Evelene CroonKaur. Patient is provided with prescriptions for medications upon discharge.   Physical Findings: AIMS: Facial and Oral Movements Muscles of Facial Expression: None, normal Lips and Perioral Area: None, normal Jaw: None, normal Tongue: None, normal,Extremity Movements Upper (arms, wrists, hands, fingers): None, normal Lower (legs, knees, ankles, toes): None, normal, Trunk Movements Neck, shoulders, hips: None, normal, Overall Severity Severity of abnormal movements (highest score from questions above): None, normal Incapacitation due to abnormal movements: None, normal Patient's awareness of abnormal movements (rate only patient's report): No Awareness, Dental Status Current problems with teeth and/or dentures?: No Does patient usually wear dentures?: No  CIWA:  CIWA-Ar Total: 0 COWS:  COWS Total Score: 3  Musculoskeletal: Strength & Muscle Tone: within normal limits Gait & Station:  normal Patient leans: N/A  Psychiatric Specialty Exam: Physical Exam  Nursing note and vitals reviewed. Constitutional: She is oriented to person, place, and time. She appears well-developed and well-nourished.  Cardiovascular: Normal rate.  Respiratory: Effort normal.  Neurological: She is alert and oriented to person, place, and time.    Review of Systems  Constitutional: Negative.   Psychiatric/Behavioral: Positive for depression (improving) and substance abuse (BZD, ETOH). Negative for hallucinations, memory loss and suicidal ideas. The patient is not nervous/anxious and does not have insomnia.     Blood pressure 126/87, pulse 89, temperature 97.8 F (36.6 C), temperature source Oral, resp. rate 20, height 5\' 5"  (1.651 m), weight 69.9 kg, SpO2 99 %.Body mass index is 25.63 kg/m.  See MD's discharge SRA     Have you used any form of tobacco in the last 30 days? (Cigarettes, Smokeless Tobacco, Cigars, and/or Pipes): Yes  Has this patient used any form of tobacco in the last 30 days? (Cigarettes, Smokeless Tobacco, Cigars, and/or Pipes) Yes, a prescription for an FDA-approved medication for tobacco cessation was offered at discharge.    Blood Alcohol level:  Lab Results  Component Value Date   ETH <10 01/23/2019   ETH <5 07/17/2017    Metabolic Disorder Labs:  Lab Results  Component Value Date   HGBA1C 5.5 01/23/2019   MPG 111.15 01/23/2019   MPG 100 07/19/2017   No results found for: PROLACTIN Lab Results  Component Value Date   CHOL 196 01/23/2019   TRIG 186 (H) 01/23/2019   HDL 36 (L) 01/23/2019   CHOLHDL 5.4 01/23/2019   VLDL 37 01/23/2019   LDLCALC 123 (H) 01/23/2019   LDLCALC 99 07/19/2017    See Psychiatric Specialty Exam and Suicide Risk Assessment completed by Attending Physician prior to discharge.  Discharge destination:  Home  Is patient on multiple antipsychotic therapies at discharge:  No   Has Patient had three or more failed trials of  antipsychotic monotherapy by history:  No  Recommended Plan for Multiple Antipsychotic Therapies: NA  Discharge Instructions    Discharge instructions   Complete by:  As directed    Patient is instructed to take all prescribed medications as recommended. Report any side effects or adverse reactions to your outpatient psychiatrist. Patient is instructed to abstain from alcohol and illegal drugs while on prescription medications. In the event of worsening symptoms, patient is instructed to call the crisis hotline, 911, or go to the nearest emergency department for evaluation and treatment.   Discharge instructions   Complete by:  As directed    Patient is instructed to take all prescribed medications as recommended. Report  any side effects or adverse reactions to your outpatient psychiatrist. Patient is instructed to abstain from alcohol and illegal drugs while on prescription medications. In the event of worsening symptoms, patient is instructed to call the crisis hotline, 911, or go to the nearest emergency department for evaluation and treatment.     Allergies as of 02/01/2019   No Known Allergies     Medication List    STOP taking these medications   nicotine 21 mg/24hr patch Commonly known as:  NICODERM CQ - dosed in mg/24 hours   phentermine 37.5 MG capsule     TAKE these medications     Indication  ALPRAZolam 1 MG tablet Commonly known as:  XANAX Take 1 tablet (1 mg total) by mouth 3 (three) times daily as needed for anxiety. What changed:  when to take this  Indication:  Feeling Anxious   metoprolol tartrate 25 MG tablet Commonly known as:  LOPRESSOR Take 0.5 tablets (12.5 mg total) by mouth 2 (two) times daily. For high blood pressure  Indication:  High Blood Pressure Disorder   QUEtiapine 50 MG tablet Commonly known as:  SEROQUEL Take 3 tablets (150 mg total) by mouth at bedtime. For mood  Indication:  Mood   rOPINIRole 3 MG tablet Commonly known as:   REQUIP Take 3 mg by mouth at bedtime.  Indication:  Restless Leg Syndrome   sertraline 50 MG tablet Commonly known as:  ZOLOFT Take 1 tablet (50 mg total) by mouth daily. For mood Start taking on:  February 02, 2019  Indication:  Mood   traZODone 50 MG tablet Commonly known as:  DESYREL Take 1 tablet (50 mg total) by mouth at bedtime as needed for sleep.  Indication:  Trouble Sleeping      Follow-up Information    [facility] Follow up on 02/01/2019.   Why:  Hospital follow up appointment is 2/7 at 9:00a. Please bring your photo ID, proof of insurance, SSN, current medications, and discharge from this hospitalization.  Contact information: [address] [place] [id number] [phone]        Joanna EvenerKaur, Rupinder, MD. Go on 02/21/2019.   Specialty:  Psychiatry Why:  Medication Management appointment with Dr. Evelene CroonKaur on 02/21/19 at 3:30pm. Please bring your current medications and discharge summary from this hospitilization with you. Contact information: [address] [address] [place] [address] [phone]           Follow-up recommendations: Activity as tolerated. Diet as recommended by primary care physician. Keep all scheduled follow-up appointments as recommended.   Comments:   Patient is instructed to take all prescribed medications as recommended. Report any side effects or adverse reactions to your outpatient psychiatrist. Patient is instructed to abstain from alcohol and illegal drugs while on prescription medications. In the event of worsening symptoms, patient is instructed to call the crisis hotline, 911, or go to the nearest emergency department for evaluation and treatment.  Signed: Aldean BakerJanet E Sykes, NP 02/01/2019, 2:20 PM   Patient seen, Suicide Assessment Completed.  Disposition Plan Reviewed

## 2019-02-01 NOTE — Progress Notes (Signed)
Recreation Therapy Notes  Date:  2.7..20 Time: 0930 Location: [address] Dayroom  Group Topic: Stress Management  Goal Area(s) Addresses:  Patient will identify positive stress management techniques. Patient will identify benefits of using stress management post d/c.  Intervention:  Stress Management  Activity :   Meditation.  LRT introduced the stress management technique of meditation.  LRT played a meditation that focused on the idea of "pure possibility" to bring into the day.  Patients were to listen to the meditation and follow along as it played to engage.  Education:  Stress Management, Discharge Planning.   Education Outcome: Acknowledges Education  Clinical Observations/Feedback:  Pt did not attend group.      Zandra Lajeunesse, LRT/CTRS         Joanna Simpson A 02/01/2019 11:30 AM 

## 2019-02-01 NOTE — [facility] Suicide Risk Assessment (Signed)
[facility]icide [facility] Assessment   Principal Problem: <principal problem not specified> Discharge Diagnoses: Active Problems:   MDD (major depressive disorder), recurrent, severe, with psychosis (HCC)   Total Time spent with patient: 30 minutes  Musculoskeletal: Strength & Muscle Tone: within normal limits Gait & Station: normal Patient leans: N/A  Psychiatric Specialty Exam: ROS no headache, no chest pain, no shortness of breath, no vomiting , no diarrhea, no rash, no fever, no chills   Blood pressure 126/87, pulse 89, temperature 97.8 F (36.6 C), temperature source Oral, resp. rate 20, height 5\' 5"  (1.651 m), weight 69.9 kg, SpO2 99 %.Body mass index is 25.63 kg/m.  General Appearance: Well Groomed  Eye Contact::  Good  Speech:  Normal Rate409  Volume:  Normal  Mood:  improved , currently euthymic, and states " today I feel great"  Affect:  Appropriate and Full Range  Thought Process:  Linear and Descriptions of Associations: Intact  Orientation:  Full (Time, Place, and Person)  Thought Content:  no hallucinations, no delusions, not internally preoccupied   Suicidal Thoughts:  No denies suicidal or self injurious ideations, denies any homicidal or violent ideations   Homicidal Thoughts:  No  Memory:  recent and remote grossly intact   Judgement:  Fair/ improving   Insight:  Fair/ improving   Psychomotor Activity:  Normal- no tremors or psychomotor agitation  Concentration:  Good  Recall:  Good  Fund of Knowledge:Good  Language: Good  Akathisia:  Negative  Handed:  Right  AIMS (if indicated):     Assets:  Desire for Improvement Resilience  Sleep:  Number of Hours: 6.75  Cognition: WNL  ADL's:  Intact   Mental Status Per Nursing Assessment::   On Admission:  Suicidal ideation indicated by patient  Demographic Factors:  [age], married, has three adult children, lives with husband,   Loss Factors: Recent amnestic, " blackout" episodes  Historical  Factors: History of depression, anxiety, history of prior psychiatric admission in 2018. History of prior suicide attempt by overdose. History of long term management with BZDs. States she has been prescribed Xanax for 15 + years .  Risk Reduction Factors:   No gross cognitive deficits noted upon discharge. Is alert , attentive, and oriented x 3   Continued Clinical Symptoms:  At this time patient is alert, attentive, well related, reports feeling " a lot better ", and today states " this is the best I have felt in a long while", affect is reactive, full in range, no thought disorder, no suicidal or self injurious ideations, no homicidal or violent ideations, no psychotic symptoms . Of note, has had no blackouts, no dissociative episodes , no amnestic episodes since her admission and presents fully alert and attentive, oriented x 3. Behavior on unit in good control. Denies medication side effects.  Cognitive Features That Contribute To Risk:  No gross cognitive deficits noted upon discharge. Is alert , attentive, and oriented x 3   Suicide Risk:  Mild:  Suicidal ideation of limited frequency, intensity, duration, and specificity.  There are no identifiable plans, no associated intent, mild dysphoria and related symptoms, good self-control (both objective and subjective assessment), few other risk factors, and identifiable protective factors, including available and accessible social support.  Follow-up Information    [facility] Follow up on 02/01/2019.   Why:  Hospital follow up appointment is 2/7 at 9:00a. Please bring your photo ID, proof of insurance, SSN, current medications, and discharge from this hospitalization.  Contact  information: [address] [place] [address] [phone]        [name], MD. Go on 02/21/2019.   Specialty:  Psychiatry Why:  Medication Management appointment with [name] on 02/21/19 at 3:30pm. Please bring your current medications and  discharge summary from this hospitilization with you. Contact information: [address] [place] [address] [phone]           Plan Of Care/Follow-up recommendations:  Activity:  as tolerated  Diet:  regular Tests:  NA Other:  See below  Patient expresses readiness for discharge and is leaving unit in good spirits. Plans to return home Plans to follow up as above . She also has an established PCP , Dr. Cira Servant , for medical management as needed . I have encouraged her to discuss possible  gradual BZD taper with her outpatient provider , had reviewed potential risks associated with BZDs, including its potential sedating , amnestic properties, particularly in combination with other sedating substances such as ETOH,  and have recommended for patient not to drive unless specifically sanctioned by her outpatient MD. Also, have recommended she consider seeing a Neurologist for outpatient management .  Craige Cotta, MD 02/01/2019, 9:30 AM

## 2019-06-26 ENCOUNTER — Other Ambulatory Visit: Payer: Self-pay | Admitting: Pediatric Intensive Care

## 2019-06-26 DIAGNOSIS — Z20822 Contact with and (suspected) exposure to covid-19: Secondary | ICD-10-CM

## 2019-07-01 LAB — NOVEL CORONAVIRUS, NAA: SARS-CoV-2, NAA: NOT DETECTED

## 2019-07-12 ENCOUNTER — Telehealth: Payer: Self-pay | Admitting: General Practice

## 2019-07-12 NOTE — Telephone Encounter (Signed)
Pt given negative Covid-19 test results Spoke with pt directly.

## 2019-09-25 ENCOUNTER — Other Ambulatory Visit: Payer: Self-pay

## 2019-09-25 DIAGNOSIS — Z20822 Contact with and (suspected) exposure to covid-19: Secondary | ICD-10-CM

## 2019-09-27 LAB — NOVEL CORONAVIRUS, NAA: SARS-CoV-2, NAA: NOT DETECTED

## 2019-10-03 ENCOUNTER — Other Ambulatory Visit (HOSPITAL_COMMUNITY): Payer: Self-pay | Admitting: Obstetrics and Gynecology

## 2019-10-03 DIAGNOSIS — Z1231 Encounter for screening mammogram for malignant neoplasm of breast: Secondary | ICD-10-CM

## 2019-10-17 ENCOUNTER — Other Ambulatory Visit: Payer: Self-pay

## 2019-10-17 ENCOUNTER — Ambulatory Visit (HOSPITAL_COMMUNITY)
Admission: RE | Admit: 2019-10-17 | Discharge: 2019-10-17 | Disposition: A | Discharge status: Home / Self Care | Payer: Managed Care, Other (non HMO) | Source: Ambulatory Visit | Attending: Obstetrics and Gynecology | Admitting: Obstetrics and Gynecology

## 2019-10-17 DIAGNOSIS — Z1231 Encounter for screening mammogram for malignant neoplasm of breast: Secondary | ICD-10-CM | POA: Insufficient documentation

## 2019-11-13 ENCOUNTER — Other Ambulatory Visit: Payer: Self-pay

## 2019-11-13 DIAGNOSIS — Z20822 Contact with and (suspected) exposure to covid-19: Secondary | ICD-10-CM

## 2019-11-15 LAB — NOVEL CORONAVIRUS, NAA: SARS-CoV-2, NAA: NOT DETECTED

## 2020-04-10 ENCOUNTER — Ambulatory Visit: Payer: Self-pay | Attending: Internal Medicine

## 2020-04-10 DIAGNOSIS — Z23 Encounter for immunization: Secondary | ICD-10-CM

## 2020-04-10 NOTE — Progress Notes (Signed)
   Covid-19 Vaccination Clinic  Name:  Joanna Simpson    MRN: [id number] DOB: February 10, 1966  04/10/2020  Joanna Simpson was observed post Covid-19 immunization for 15 minutes without incident. She was provided with Vaccine Information Sheet and instruction to access the V-Safe system.   Joanna Simpson was instructed to call 911 with any severe reactions post vaccine: Marland Kitchen Difficulty breathing  . Swelling of face and throat  . A fast heartbeat  . A bad rash all over body  . Dizziness and weakness   Immunizations Administered    Name Date Dose VIS Date Route   Moderna COVID-19 Vaccine 04/10/2020 11:30 AM 0.5 mL 11/26/2019 Intramuscular   Manufacturer: Moderna   Lot: [id number]   NDC: 88648-472-07

## 2020-05-12 ENCOUNTER — Ambulatory Visit: Payer: Medicaid Other | Attending: Internal Medicine

## 2020-05-12 DIAGNOSIS — Z23 Encounter for immunization: Secondary | ICD-10-CM

## 2020-05-12 NOTE — Progress Notes (Signed)
   Covid-19 Vaccination Clinic  Name:  Joanna Simpson    MRN: [id number] DOB: Apr 27, 1966  05/12/2020  Joanna Simpson was observed post Covid-19 immunization for 15 minutes without incident. She was provided with Vaccine Information Sheet and instruction to access the V-Safe system.   Joanna Simpson was instructed to call 911 with any severe reactions post vaccine: Marland Kitchen Difficulty breathing  . Swelling of face and throat  . A fast heartbeat  . A bad rash all over body  . Dizziness and weakness   Immunizations Administered    Name Date Dose VIS Date Route   Moderna COVID-19 Vaccine 05/12/2020 11:36 AM 0.5 mL 11/2019 Intramuscular   Manufacturer: Moderna   Lot: [id number]   NDC: 41364-383-77

## 2020-09-28 ENCOUNTER — Other Ambulatory Visit (HOSPITAL_COMMUNITY): Payer: Self-pay | Admitting: Obstetrics and Gynecology

## 2020-09-28 ENCOUNTER — Other Ambulatory Visit (HOSPITAL_COMMUNITY): Payer: Self-pay | Admitting: Family Medicine

## 2020-09-28 DIAGNOSIS — Z1231 Encounter for screening mammogram for malignant neoplasm of breast: Secondary | ICD-10-CM

## 2020-10-19 ENCOUNTER — Other Ambulatory Visit: Payer: Self-pay

## 2020-10-19 ENCOUNTER — Ambulatory Visit (HOSPITAL_COMMUNITY)
Admission: RE | Admit: 2020-10-19 | Discharge: 2020-10-19 | Disposition: A | Discharge status: Home / Self Care | Payer: Medicaid Other | Source: Ambulatory Visit | Attending: Family Medicine | Admitting: Family Medicine

## 2020-10-19 DIAGNOSIS — Z1231 Encounter for screening mammogram for malignant neoplasm of breast: Secondary | ICD-10-CM

## 2021-01-28 IMAGING — MG DIGITAL SCREENING BILAT W/ TOMO W/ CAD
8 series · 9 of 24 positions shown · non-contrast
Comparison: Previous exam(s).

CLINICAL DATA: Screening.

EXAM:
DIGITAL SCREENING BILATERAL MAMMOGRAM WITH TOMO AND CAD

[L MLO synth-2D]
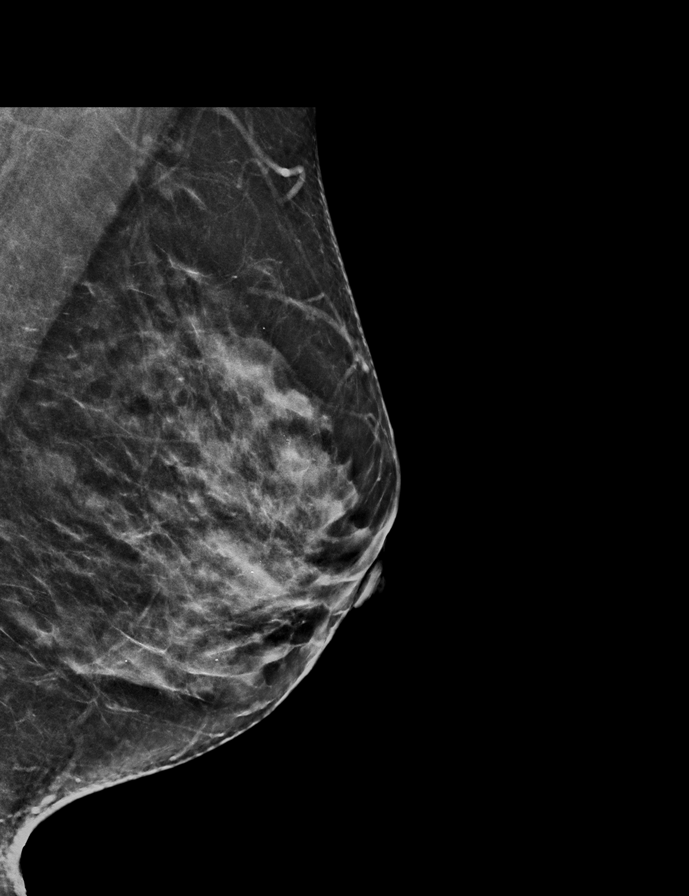

[R CC synth-2D]
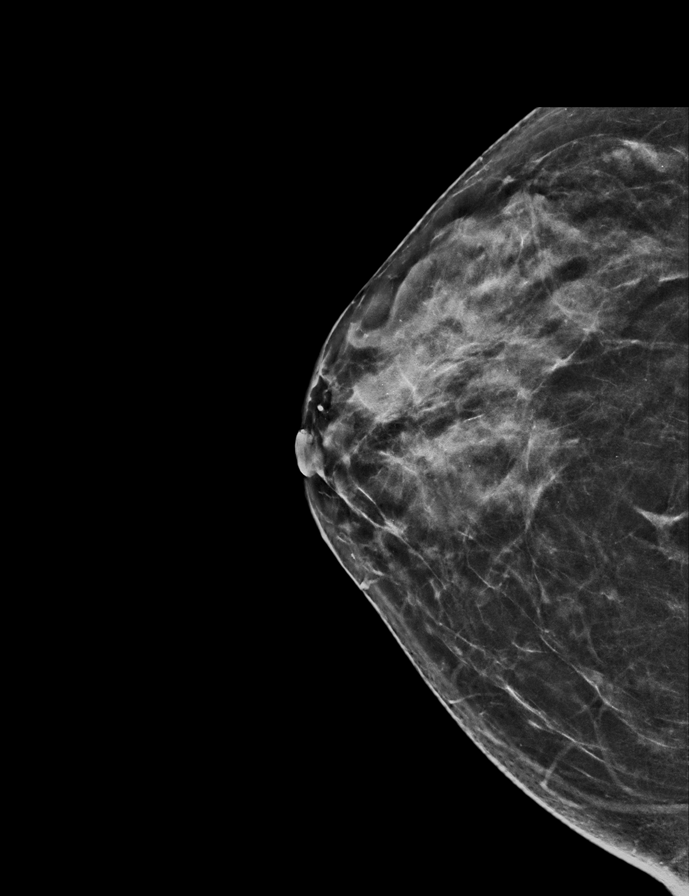

[R MLO synth-2D]
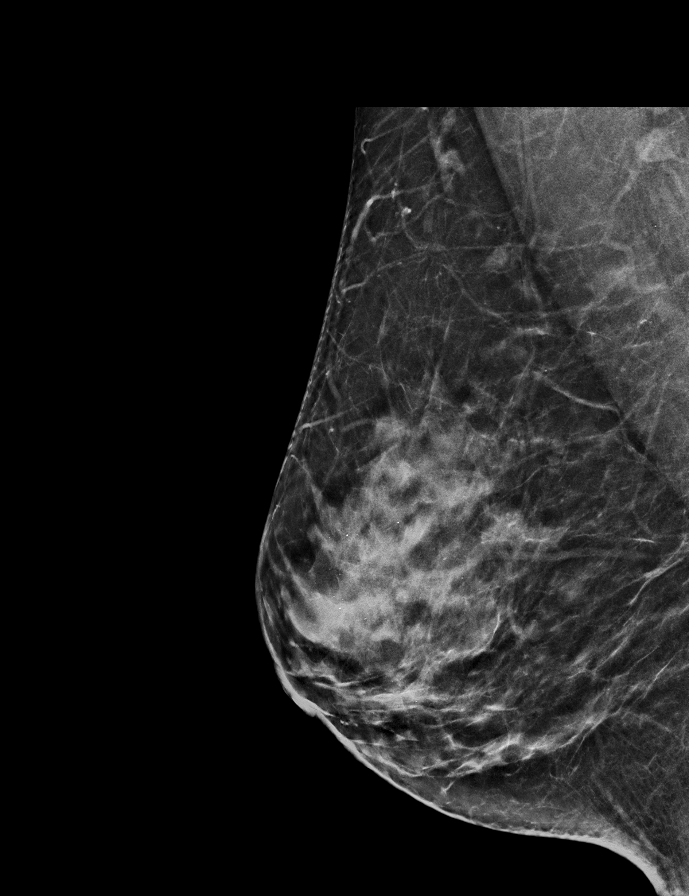

[L CC synth-2D]
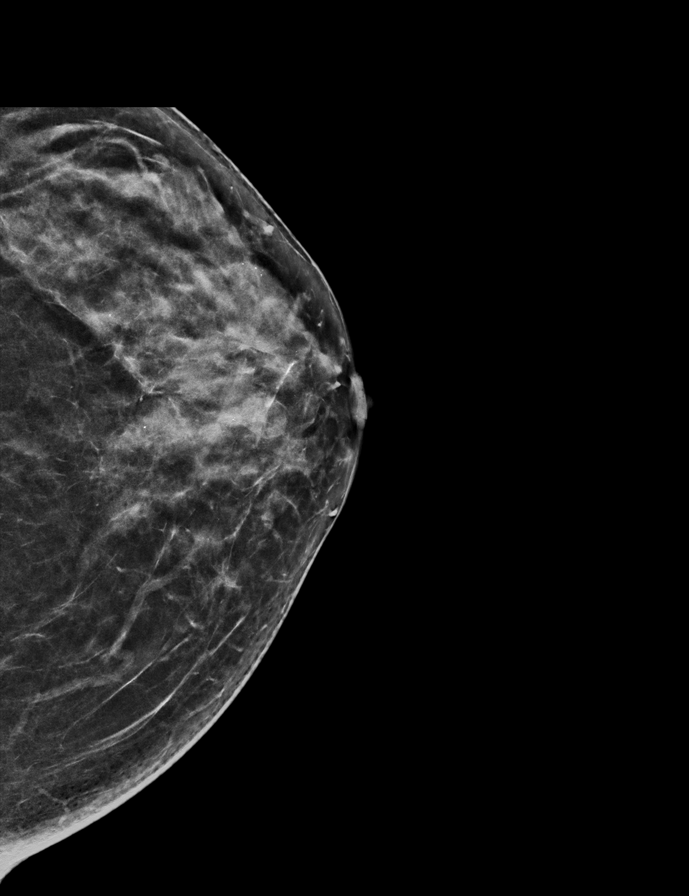

[R MLO tomo · 2 of 61 frames shown]
[frame 20/61]
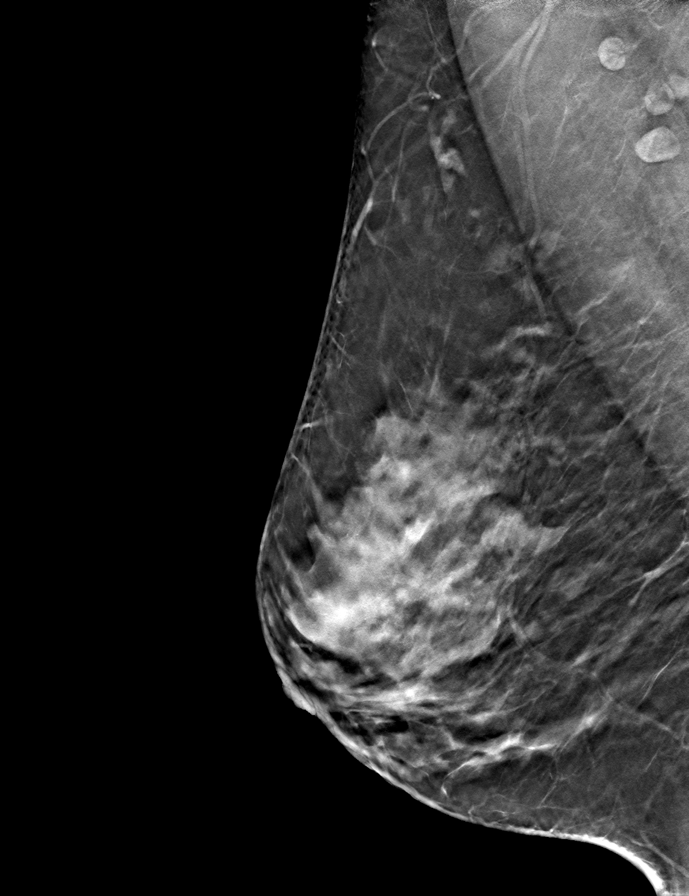
[frame 31/61]
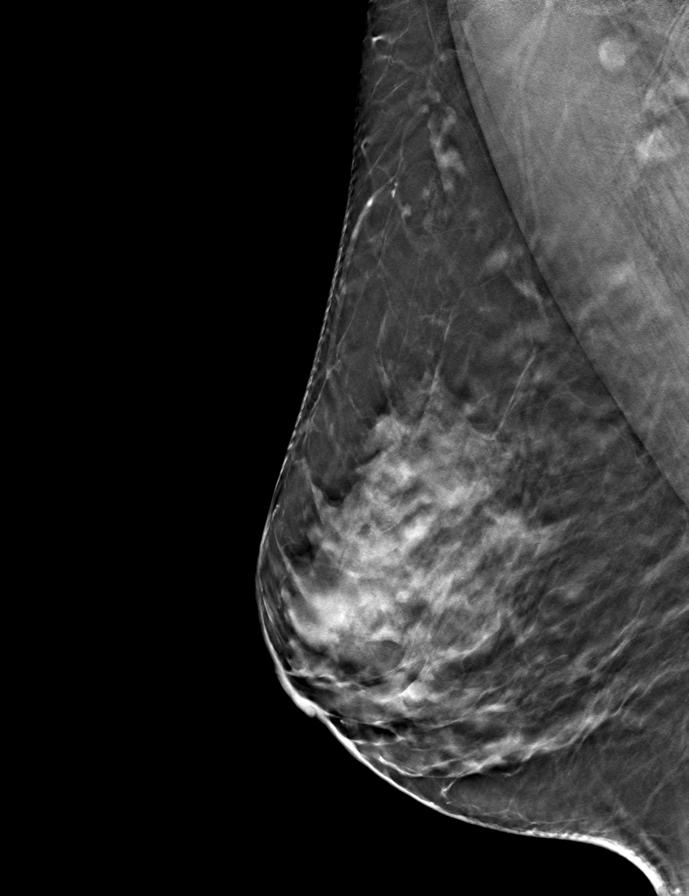

[L MLO tomo · tomo slice 31/61.0]
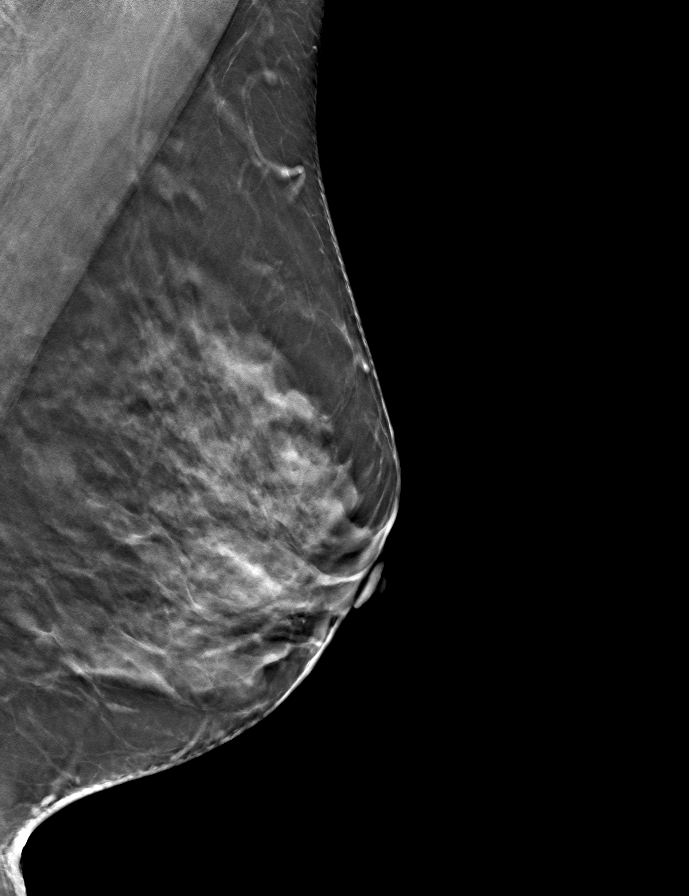

[R CC tomo · tomo slice 30/59.0]
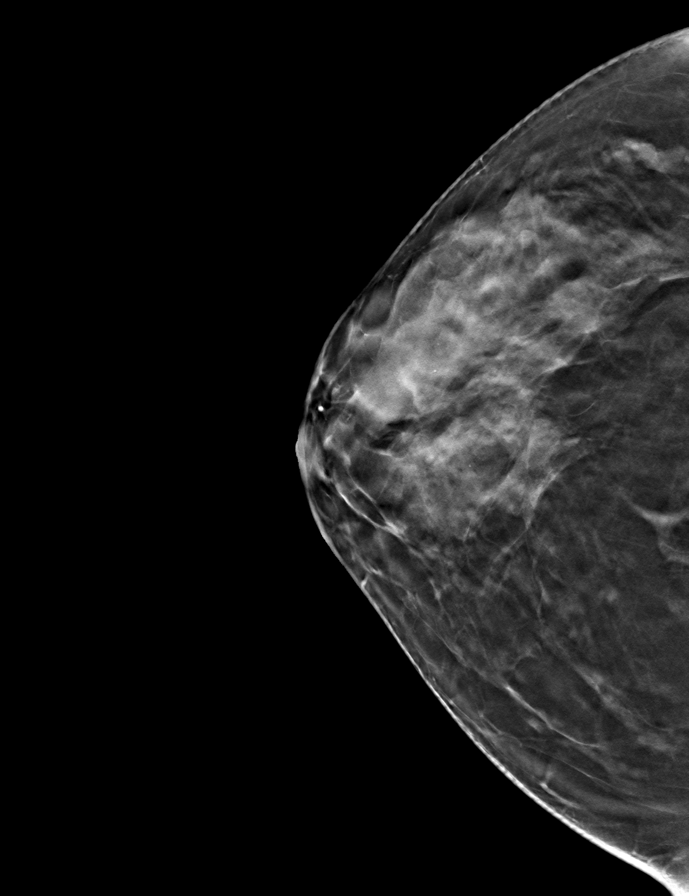

[L CC tomo · tomo slice 31/62.0]
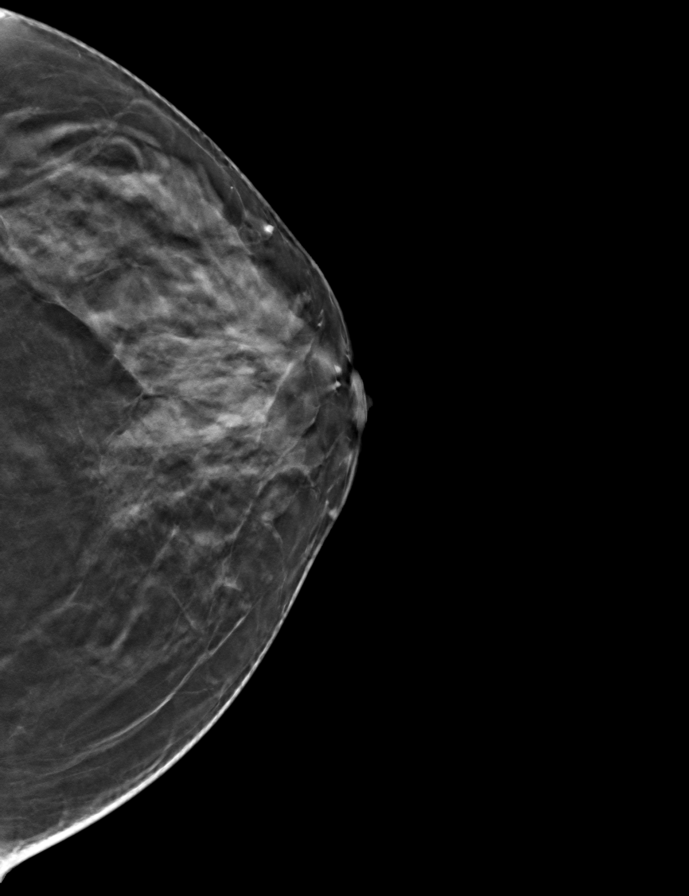

[9 of 24 positions shown; findings below may reference images not displayed]

ACR Breast Density Category c: The breast tissue is heterogeneously
dense, which may obscure small masses.
FINDINGS: There are no findings suspicious for malignancy. Images were
processed with CAD.
IMPRESSION: No mammographic evidence of malignancy. A result letter of this
screening mammogram will be mailed directly to the patient.

RECOMMENDATION:
Screening mammogram in one year. (Code:[id number])

BI-RADS CATEGORY  1: Negative.
